# Patient Record
Sex: Female | Born: 1950 | Race: White | Hispanic: No | Marital: Married | Smoking: Former smoker
Health system: Southern US, Community
[De-identification: ages and names within clinical notes are randomized; demographics above are authoritative.]

## PROBLEM LIST (undated history)

## (undated) DIAGNOSIS — N390 Urinary tract infection, site not specified: Secondary | ICD-10-CM

## (undated) DIAGNOSIS — E119 Type 2 diabetes mellitus without complications: Secondary | ICD-10-CM

## (undated) DIAGNOSIS — Z860100 Personal history of colon polyps, unspecified: Secondary | ICD-10-CM

## (undated) DIAGNOSIS — R600 Localized edema: Secondary | ICD-10-CM

## (undated) DIAGNOSIS — G8929 Other chronic pain: Secondary | ICD-10-CM

## (undated) DIAGNOSIS — F32A Depression, unspecified: Secondary | ICD-10-CM

## (undated) DIAGNOSIS — E785 Hyperlipidemia, unspecified: Secondary | ICD-10-CM

## (undated) DIAGNOSIS — M549 Dorsalgia, unspecified: Secondary | ICD-10-CM

## (undated) DIAGNOSIS — M2550 Pain in unspecified joint: Secondary | ICD-10-CM

## (undated) DIAGNOSIS — M069 Rheumatoid arthritis, unspecified: Secondary | ICD-10-CM

## (undated) DIAGNOSIS — G4700 Insomnia, unspecified: Secondary | ICD-10-CM

## (undated) DIAGNOSIS — M1990 Unspecified osteoarthritis, unspecified site: Secondary | ICD-10-CM

## (undated) DIAGNOSIS — C55 Malignant neoplasm of uterus, part unspecified: Secondary | ICD-10-CM

## (undated) DIAGNOSIS — F329 Major depressive disorder, single episode, unspecified: Secondary | ICD-10-CM

## (undated) DIAGNOSIS — M2540 Effusion, unspecified joint: Secondary | ICD-10-CM

## (undated) DIAGNOSIS — K219 Gastro-esophageal reflux disease without esophagitis: Secondary | ICD-10-CM

## (undated) DIAGNOSIS — N393 Stress incontinence (female) (male): Secondary | ICD-10-CM

## (undated) DIAGNOSIS — G43909 Migraine, unspecified, not intractable, without status migrainosus: Secondary | ICD-10-CM

## (undated) DIAGNOSIS — I10 Essential (primary) hypertension: Secondary | ICD-10-CM

## (undated) DIAGNOSIS — R609 Edema, unspecified: Secondary | ICD-10-CM

## (undated) DIAGNOSIS — I82409 Acute embolism and thrombosis of unspecified deep veins of unspecified lower extremity: Secondary | ICD-10-CM

## (undated) HISTORY — PX: COLONOSCOPY: SHX174

## (undated) HISTORY — PX: EYE SURGERY: SHX253

## (undated) HISTORY — PX: JOINT REPLACEMENT: SHX530

---

## 1976-11-01 HISTORY — PX: TUBAL LIGATION: SHX77

## 1979-07-03 HISTORY — PX: BREAST BIOPSY: SHX20

## 1989-07-02 HISTORY — PX: CHOLECYSTECTOMY: SHX55

## 2012-01-31 HISTORY — PX: ABDOMINAL HYSTERECTOMY: SHX81

## 2013-04-02 ENCOUNTER — Other Ambulatory Visit (HOSPITAL_BASED_OUTPATIENT_CLINIC_OR_DEPARTMENT_OTHER): Payer: Self-pay | Admitting: Neurology

## 2013-04-02 DIAGNOSIS — M5416 Radiculopathy, lumbar region: Secondary | ICD-10-CM

## 2013-04-07 ENCOUNTER — Ambulatory Visit (HOSPITAL_BASED_OUTPATIENT_CLINIC_OR_DEPARTMENT_OTHER)
Admission: RE | Admit: 2013-04-07 | Discharge: 2013-04-07 | Disposition: A | Discharge status: Home / Self Care | Payer: BC Managed Care – PPO | Source: Ambulatory Visit | Attending: Neurology | Admitting: Neurology

## 2013-04-07 DIAGNOSIS — M5416 Radiculopathy, lumbar region: Secondary | ICD-10-CM

## 2013-04-07 DIAGNOSIS — R29890 Loss of height: Secondary | ICD-10-CM | POA: Insufficient documentation

## 2013-04-07 DIAGNOSIS — Z8542 Personal history of malignant neoplasm of other parts of uterus: Secondary | ICD-10-CM | POA: Insufficient documentation

## 2013-04-07 DIAGNOSIS — M79609 Pain in unspecified limb: Secondary | ICD-10-CM | POA: Insufficient documentation

## 2013-04-07 DIAGNOSIS — M51379 Other intervertebral disc degeneration, lumbosacral region without mention of lumbar back pain or lower extremity pain: Secondary | ICD-10-CM | POA: Insufficient documentation

## 2013-04-07 DIAGNOSIS — M5137 Other intervertebral disc degeneration, lumbosacral region: Secondary | ICD-10-CM | POA: Insufficient documentation

## 2013-04-07 DIAGNOSIS — M47817 Spondylosis without myelopathy or radiculopathy, lumbosacral region: Secondary | ICD-10-CM | POA: Insufficient documentation

## 2013-10-17 ENCOUNTER — Other Ambulatory Visit: Payer: Self-pay | Admitting: Rheumatology

## 2013-10-17 ENCOUNTER — Ambulatory Visit
Admission: RE | Admit: 2013-10-17 | Discharge: 2013-10-17 | Disposition: A | Discharge status: Home / Self Care | Payer: BC Managed Care – PPO | Source: Ambulatory Visit | Attending: Rheumatology | Admitting: Rheumatology

## 2013-10-17 DIAGNOSIS — M25552 Pain in left hip: Secondary | ICD-10-CM

## 2013-10-17 DIAGNOSIS — M25551 Pain in right hip: Secondary | ICD-10-CM

## 2013-10-19 ENCOUNTER — Other Ambulatory Visit: Payer: Self-pay | Admitting: Rheumatology

## 2013-10-19 DIAGNOSIS — M25552 Pain in left hip: Secondary | ICD-10-CM

## 2013-10-23 ENCOUNTER — Ambulatory Visit
Admission: RE | Admit: 2013-10-23 | Discharge: 2013-10-23 | Disposition: A | Discharge status: Home / Self Care | Payer: BC Managed Care – PPO | Source: Ambulatory Visit | Attending: Rheumatology | Admitting: Rheumatology

## 2013-10-23 DIAGNOSIS — M25552 Pain in left hip: Secondary | ICD-10-CM

## 2013-10-23 MED ORDER — IOHEXOL 180 MG/ML  SOLN
1.0000 mL | Freq: Once | INTRAMUSCULAR | Status: AC | PRN
  Administered 2013-10-23: 1 mL via INTRA_ARTICULAR

## 2013-10-23 MED ORDER — METHYLPREDNISOLONE ACETATE 40 MG/ML INJ SUSP (RADIOLOG
120.0000 mg | Freq: Once | INTRAMUSCULAR | Status: AC
  Administered 2013-10-23: 120 mg via INTRA_ARTICULAR

## 2013-12-11 ENCOUNTER — Other Ambulatory Visit: Payer: Self-pay | Admitting: Rheumatology

## 2013-12-11 DIAGNOSIS — M25551 Pain in right hip: Secondary | ICD-10-CM

## 2013-12-11 DIAGNOSIS — R1030 Lower abdominal pain, unspecified: Secondary | ICD-10-CM

## 2013-12-12 ENCOUNTER — Ambulatory Visit
Admission: RE | Admit: 2013-12-12 | Discharge: 2013-12-12 | Disposition: A | Discharge status: Home / Self Care | Payer: BC Managed Care – PPO | Source: Ambulatory Visit | Attending: Rheumatology | Admitting: Rheumatology

## 2013-12-12 DIAGNOSIS — R1030 Lower abdominal pain, unspecified: Secondary | ICD-10-CM

## 2013-12-12 DIAGNOSIS — M25551 Pain in right hip: Secondary | ICD-10-CM

## 2013-12-12 MED ORDER — IOHEXOL 180 MG/ML  SOLN
1.0000 mL | Freq: Once | INTRAMUSCULAR | Status: AC | PRN
  Administered 2013-12-12: 1 mL via INTRAVENOUS

## 2013-12-12 MED ORDER — METHYLPREDNISOLONE ACETATE 40 MG/ML INJ SUSP (RADIOLOG
120.0000 mg | Freq: Once | INTRAMUSCULAR | Status: AC
  Administered 2013-12-12: 120 mg via INTRA_ARTICULAR

## 2014-06-11 ENCOUNTER — Other Ambulatory Visit: Payer: Self-pay | Admitting: Orthopaedic Surgery

## 2014-07-03 ENCOUNTER — Encounter (HOSPITAL_COMMUNITY): Payer: Self-pay | Admitting: Pharmacy Technician

## 2014-07-04 NOTE — Pre-Procedure Instructions (Signed)
Ashley Sims  07/04/2014   Your procedure is scheduled on:  Tues, Sept 15 @ 7:30 AM  Report to [facility]  at 5:30 AM.  Call this number if you have problems the morning of surgery: [phone]   Remember:   Do not eat food or drink liquids after midnight.   Take these medicines the morning of surgery with A SIP OF WATER: Pain Pill(if needed) and Zoloft(Sertraline)              Stop taking your Methotrexate and Meloxicam. No Goody's,BC's,Aleve,Aspirin,Ibuprofen,Fish Oil,or any Herbal Medicaitons                  Do not wear jewelry, make-up or nail polish.  Do not wear lotions, powders, or perfumes. You may wear deodorant.  Do not shave 48 hours prior to surgery.   Do not bring valuables to the hospital.  [facility] is not responsible                  for any belongings or valuables.               Contacts, dentures or bridgework may not be worn into surgery.  Leave suitcase in the car. After surgery it may be brought to your room.  For patients admitted to the hospital, discharge time is determined by your                treatment team.              Special Instructions:  [place] - Preparing for Surgery  Before surgery, you can play an important role.  Because skin is not sterile, your skin needs to be as free of germs as possible.  You can reduce the number of germs on you skin by washing with CHG (chlorahexidine gluconate) soap before surgery.  CHG is an antiseptic cleaner which kills germs and bonds with the skin to continue killing germs even after washing.  Please DO NOT use if you have an allergy to CHG or antibacterial soaps.  If your skin becomes reddened/irritated stop using the CHG and inform your nurse when you arrive at Short Stay.  Do not shave (including legs and underarms) for at least 48 hours prior to the first CHG shower.  You may shave your face.  Please follow these instructions carefully:   1.  Shower with CHG Soap the night before surgery and  the                                morning of Surgery.  2.  If you choose to wash your hair, wash your hair first as usual with your       normal shampoo.  3.  After you shampoo, rinse your hair and body thoroughly to remove the                      Shampoo.  4.  Use CHG as you would any other liquid soap.  You can apply chg directly       to the skin and wash gently with scrungie or a clean washcloth.  5.  Apply the CHG Soap to your body ONLY FROM THE NECK DOWN.        Do not use on open wounds or open sores.  Avoid contact with your eyes,       ears, mouth  and genitals (private parts).  Wash genitals (private parts)       with your normal soap.  6.  Wash thoroughly, paying special attention to the area where your surgery        will be performed.  7.  Thoroughly rinse your body with warm water from the neck down.  8.  DO NOT shower/wash with your normal soap after using and rinsing off       the CHG Soap.  9.  Pat yourself dry with a clean towel.            10.  Wear clean pajamas.            11.  Place clean sheets on your bed the night of your first shower and do not        sleep with pets.  Day of Surgery  Do not apply any lotions/deoderants the morning of surgery.  Please wear clean clothes to the hospital/surgery center.     Please read over the following fact sheets that you were given: Pain Booklet, Coughing and Deep Breathing, Blood Transfusion Information, MRSA Information and Surgical Site Infection Prevention

## 2014-07-05 ENCOUNTER — Encounter (HOSPITAL_COMMUNITY)
Admission: RE | Admit: 2014-07-05 | Discharge: 2014-07-05 | Disposition: A | Discharge status: Home / Self Care | Payer: BC Managed Care – PPO | Source: Ambulatory Visit | Attending: Orthopaedic Surgery | Admitting: Orthopaedic Surgery

## 2014-07-05 ENCOUNTER — Ambulatory Visit (HOSPITAL_COMMUNITY)
Admission: RE | Admit: 2014-07-05 | Discharge: 2014-07-05 | Disposition: A | Discharge status: Home / Self Care | Payer: BC Managed Care – PPO | Source: Ambulatory Visit | Attending: Orthopaedic Surgery | Admitting: Orthopaedic Surgery

## 2014-07-05 ENCOUNTER — Encounter (HOSPITAL_COMMUNITY): Payer: Self-pay

## 2014-07-05 DIAGNOSIS — Z87891 Personal history of nicotine dependence: Secondary | ICD-10-CM | POA: Insufficient documentation

## 2014-07-05 DIAGNOSIS — Z9889 Other specified postprocedural states: Secondary | ICD-10-CM | POA: Insufficient documentation

## 2014-07-05 DIAGNOSIS — Z01818 Encounter for other preprocedural examination: Secondary | ICD-10-CM | POA: Insufficient documentation

## 2014-07-05 DIAGNOSIS — E119 Type 2 diabetes mellitus without complications: Secondary | ICD-10-CM | POA: Insufficient documentation

## 2014-07-05 HISTORY — DX: Urinary tract infection, site not specified: N39.0

## 2014-07-05 HISTORY — DX: Essential (primary) hypertension: I10

## 2014-07-05 HISTORY — DX: Insomnia, unspecified: G47.00

## 2014-07-05 HISTORY — DX: Hyperlipidemia, unspecified: E78.5

## 2014-07-05 HISTORY — DX: Personal history of colonic polyps: Z86.010

## 2014-07-05 HISTORY — DX: Depression, unspecified: F32.A

## 2014-07-05 HISTORY — DX: Dorsalgia, unspecified: M54.9

## 2014-07-05 HISTORY — DX: Pain in unspecified joint: M25.50

## 2014-07-05 HISTORY — DX: Localized edema: R60.0

## 2014-07-05 HISTORY — DX: Stress incontinence (female) (male): N39.3

## 2014-07-05 HISTORY — DX: Other chronic pain: G89.29

## 2014-07-05 HISTORY — DX: Gastro-esophageal reflux disease without esophagitis: K21.9

## 2014-07-05 HISTORY — DX: Edema, unspecified: R60.9

## 2014-07-05 HISTORY — DX: Personal history of colon polyps, unspecified: Z86.0100

## 2014-07-05 HISTORY — DX: Effusion, unspecified joint: M25.40

## 2014-07-05 HISTORY — DX: Major depressive disorder, single episode, unspecified: F32.9

## 2014-07-05 LAB — CBC WITH DIFFERENTIAL/PLATELET
Basophils Absolute: 0 10*3/uL (ref 0.0–0.1)
Basophils Relative: 1 % (ref 0–1)
EOS ABS: 0.1 10*3/uL (ref 0.0–0.7)
Eosinophils Relative: 2 % (ref 0–5)
HEMATOCRIT: 40.7 % (ref 36.0–46.0)
Hemoglobin: 13.3 g/dL (ref 12.0–15.0)
LYMPHS ABS: 0.5 10*3/uL — AB (ref 0.7–4.0)
Lymphocytes Relative: 8 % — ABNORMAL LOW (ref 12–46)
MCH: 30.8 pg (ref 26.0–34.0)
MCHC: 32.7 g/dL (ref 30.0–36.0)
MCV: 94.2 fL (ref 78.0–100.0)
MONO ABS: 0.4 10*3/uL (ref 0.1–1.0)
Monocytes Relative: 7 % (ref 3–12)
Neutro Abs: 4.9 10*3/uL (ref 1.7–7.7)
Neutrophils Relative %: 82 % — ABNORMAL HIGH (ref 43–77)
PLATELETS: 301 10*3/uL (ref 150–400)
RBC: 4.32 MIL/uL (ref 3.87–5.11)
RDW: 14.8 % (ref 11.5–15.5)
WBC: 6 10*3/uL (ref 4.0–10.5)

## 2014-07-05 LAB — TYPE AND SCREEN
ABO/RH(D): A POS
Antibody Screen: NEGATIVE

## 2014-07-05 LAB — PROTIME-INR
INR: 0.94 (ref 0.00–1.49)
PROTHROMBIN TIME: 12.6 s (ref 11.6–15.2)

## 2014-07-05 LAB — BASIC METABOLIC PANEL
Anion gap: 13 (ref 5–15)
BUN: 15 mg/dL (ref 6–23)
CALCIUM: 9.8 mg/dL (ref 8.4–10.5)
CO2: 25 meq/L (ref 19–32)
Chloride: 103 mEq/L (ref 96–112)
Creatinine, Ser: 0.75 mg/dL (ref 0.50–1.10)
GFR calc Af Amer: >90 mL/min (ref >90)
GFR calc non Af Amer: 88 mL/min — ABNORMAL LOW (ref >90)
GLUCOSE: 146 mg/dL — AB (ref 70–99)
Potassium: 4.2 mEq/L (ref 3.7–5.3)
Sodium: 141 mEq/L (ref 137–147)

## 2014-07-05 LAB — URINALYSIS, ROUTINE W REFLEX MICROSCOPIC
BILIRUBIN URINE: NEGATIVE
Glucose, UA: NEGATIVE mg/dL
Hgb urine dipstick: NEGATIVE
Ketones, ur: NEGATIVE mg/dL
LEUKOCYTES UA: NEGATIVE
Nitrite: NEGATIVE
PH: 5.5 (ref 5.0–8.0)
Protein, ur: NEGATIVE mg/dL
SPECIFIC GRAVITY, URINE: 1.008 (ref 1.005–1.030)
UROBILINOGEN UA: 0.2 mg/dL (ref 0.0–1.0)

## 2014-07-05 LAB — SURGICAL PCR SCREEN
MRSA, PCR: NEGATIVE
Staphylococcus aureus: NEGATIVE

## 2014-07-05 LAB — ABO/RH: ABO/RH(D): A POS

## 2014-07-05 LAB — APTT: aPTT: 26 seconds (ref 24–37)

## 2014-07-05 MED ORDER — CHLORHEXIDINE GLUCONATE 4 % EX LIQD: 60.0000 mL | Freq: Once | CUTANEOUS | Status: DC

## 2014-07-05 NOTE — Progress Notes (Signed)
07/05/14 1117  OBSTRUCTIVE SLEEP APNEA  Have you ever been diagnosed with sleep apnea through a sleep study? No  Do you snore loudly (loud enough to be heard through closed doors)?  1  Do you often feel tired, fatigued, or sleepy during the daytime? 0  Has anyone observed you stop breathing during your sleep? 0  Do you have, or are you being treated for high blood pressure? 1  BMI more than 35 kg/m2? 1  Age over 65 years old? 1  Neck circumference greater than 40 cm/16 inches? 1  Gender: 0  Obstructive Sleep Apnea Score 5  Score 4 or greater  Results sent to PCP

## 2014-07-05 NOTE — Progress Notes (Signed)
Pt doesn't have a cardiologist  Stress test done 14 yrs ago-done with routine physical   Denies ever having an echo or heart cath  [facility] in [place] sees PA Iva Lento  Denies EKG or CXR in past yr

## 2014-07-12 NOTE — H&P (Signed)
TOTAL HIP ADMISSION H&P  Patient is admitted for right total hip arthroplasty.  Subjective:  Chief Complaint: right hip pain  HPI: Ashley Sims, 63 y.o. female, has a history of pain and functional disability in the right hip(s) due to arthritis and patient has failed non-surgical conservative treatments for greater than 12 weeks to include NSAID's and/or analgesics, corticosteriod injections, flexibility and strengthening excercises, supervised PT with diminished ADL's post treatment, use of assistive devices, weight reduction as appropriate and activity modification.  Onset of symptoms was gradual starting 5 years ago with gradually worsening course since that time.The patient noted no past surgery on the right hip(s).  Patient currently rates pain in the bilaterally hip at 10 out of 10 with activity. Patient has night pain, worsening of pain with activity and weight bearing, pain that interfers with activities of daily living and crepitus. Patient has evidence of subchondral cysts, subchondral sclerosis, periarticular osteophytes and joint space narrowing by imaging studies. This condition presents safety issues increasing the risk of falls. There is no current active infection.  There are no active problems to display for this patient.  Past Medical History  Diagnosis Date  . Insomnia     takes Elavil nightly  . Hypertension     takes Lisinopril daily  . Diabetes mellitus without complication     takes Metformin daily  . Depression     takes Zoloft daily  . Hyperlipidemia     takes Simvastatin daily  . History of migraine     hasn't had one in yrs  . Cancer     uterine  . Arthritis     RA and osteo;takes Plaquenil daily and takes Methotrexate daily  . Chronic back pain     [facility];takes Oxycontin and PErcocet  . Joint pain   . Joint swelling   . GERD (gastroesophageal reflux disease)     takes Protonix daily  . History of colon polyps   . Stress  incontinence   . Peripheral edema     left foot  . UTI (lower urinary tract infection)     was treated with Cipro 3wks ago    Past Surgical History  Procedure Laterality Date  . Abdominal hysterectomy    . Cesarean section    . Breast biopsy    . Colonoscopy      No prescriptions prior to admission   Allergies  Allergen Reactions  . Bee Venom   . Tramadol     History  Substance Use Topics  . Smoking status: Former Research scientist (life sciences)  . Smokeless tobacco: Not on file     Comment: quit smoking 39yrs ago  . Alcohol Use: No    No family history on file.   Review of Systems  Musculoskeletal: Positive for joint pain.       Right hip  All other systems reviewed and are negative.   Objective:  Physical Exam  Constitutional: She is oriented to person, place, and time. She appears well-developed and well-nourished.  HENT:  Head: Normocephalic and atraumatic.  Eyes: Conjunctivae are normal. Pupils are equal, round, and reactive to light.  Neck: Normal range of motion.  Cardiovascular: Normal rate and regular rhythm.   Respiratory: Effort normal.  GI: Soft.  Musculoskeletal:  She has virtually no motion of her hips in terms of rotation.  She has about 90 of flexion on both sides with about 10 hip flexion contractures.  Leg lengths are roughly equal.  There is some  pain about her low back but no scars.  Abdominal exam is benign except for obesity.  She has no palpable lymphadenopathy at the groin and either side.  Sensation and motor function are intact in her feet with palpable pulses at both ankles.    Neurological: She is alert and oriented to person, place, and time.  Skin: Skin is warm and dry.  Psychiatric: She has a normal mood and affect. Her behavior is normal. Judgment and thought content normal.    Vital signs in last 24 hours:    Labs:   There is no height or weight on file to calculate BMI.   Imaging Review Plain radiographs demonstrate severe degenerative joint  disease of the right hip(s). The bone quality appears to be good for age and reported activity level.  Assessment/Plan:  End stage arthritis, right hip(s)  The patient history, physical examination, clinical judgement of the provider and imaging studies are consistent with end stage degenerative joint disease of the right hip(s) and total hip arthroplasty is deemed medically necessary. The treatment options including medical management, injection therapy, arthroscopy and arthroplasty were discussed at length. The risks and benefits of total hip arthroplasty were presented and reviewed. The risks due to aseptic loosening, infection, stiffness, dislocation/subluxation,  thromboembolic complications and other imponderables were discussed.  The patient acknowledged the explanation, agreed to proceed with the plan and consent was signed. Patient is being admitted for inpatient treatment for surgery, pain control, PT, OT, prophylactic antibiotics, VTE prophylaxis, progressive ambulation and ADL's and discharge planning.The patient is planning to be discharged home with home health services

## 2014-07-15 MED ORDER — CEFAZOLIN SODIUM-DEXTROSE 2-3 GM-% IV SOLR
2.0000 g | INTRAVENOUS | Status: AC
  Administered 2014-07-16: 2 g via INTRAVENOUS
  Filled 2014-07-15: qty 50

## 2014-07-16 ENCOUNTER — Encounter (HOSPITAL_COMMUNITY)
Admission: RE | Disposition: A | Discharge status: Skilled Nursing Facility | Payer: Self-pay | Source: Ambulatory Visit | Attending: Orthopaedic Surgery

## 2014-07-16 ENCOUNTER — Inpatient Hospital Stay (HOSPITAL_COMMUNITY): Payer: BC Managed Care – PPO | Admitting: Anesthesiology

## 2014-07-16 ENCOUNTER — Encounter (HOSPITAL_COMMUNITY): Payer: Self-pay | Admitting: *Deleted

## 2014-07-16 ENCOUNTER — Inpatient Hospital Stay (HOSPITAL_COMMUNITY)
Admission: RE | Admit: 2014-07-16 | Discharge: 2014-07-19 | DRG: 470 | Disposition: A | Discharge status: Skilled Nursing Facility | Payer: BC Managed Care – PPO | Source: Ambulatory Visit | Attending: Orthopaedic Surgery | Admitting: Orthopaedic Surgery

## 2014-07-16 ENCOUNTER — Encounter (HOSPITAL_COMMUNITY): Payer: BC Managed Care – PPO | Admitting: Anesthesiology

## 2014-07-16 ENCOUNTER — Inpatient Hospital Stay (HOSPITAL_COMMUNITY): Payer: BC Managed Care – PPO

## 2014-07-16 DIAGNOSIS — Z860100 Personal history of colon polyps, unspecified: Secondary | ICD-10-CM

## 2014-07-16 DIAGNOSIS — Z91038 Other insect allergy status: Secondary | ICD-10-CM | POA: Diagnosis not present

## 2014-07-16 DIAGNOSIS — F3289 Other specified depressive episodes: Secondary | ICD-10-CM | POA: Diagnosis present

## 2014-07-16 DIAGNOSIS — Z7982 Long term (current) use of aspirin: Secondary | ICD-10-CM | POA: Diagnosis not present

## 2014-07-16 DIAGNOSIS — Z888 Allergy status to other drugs, medicaments and biological substances status: Secondary | ICD-10-CM | POA: Diagnosis not present

## 2014-07-16 DIAGNOSIS — K219 Gastro-esophageal reflux disease without esophagitis: Secondary | ICD-10-CM | POA: Diagnosis present

## 2014-07-16 DIAGNOSIS — N393 Stress incontinence (female) (male): Secondary | ICD-10-CM | POA: Diagnosis present

## 2014-07-16 DIAGNOSIS — E119 Type 2 diabetes mellitus without complications: Secondary | ICD-10-CM | POA: Diagnosis present

## 2014-07-16 DIAGNOSIS — I10 Essential (primary) hypertension: Secondary | ICD-10-CM | POA: Diagnosis present

## 2014-07-16 DIAGNOSIS — M549 Dorsalgia, unspecified: Secondary | ICD-10-CM | POA: Diagnosis present

## 2014-07-16 DIAGNOSIS — F329 Major depressive disorder, single episode, unspecified: Secondary | ICD-10-CM | POA: Diagnosis present

## 2014-07-16 DIAGNOSIS — G8929 Other chronic pain: Secondary | ICD-10-CM | POA: Diagnosis present

## 2014-07-16 DIAGNOSIS — E785 Hyperlipidemia, unspecified: Secondary | ICD-10-CM | POA: Diagnosis present

## 2014-07-16 DIAGNOSIS — M1610 Unilateral primary osteoarthritis, unspecified hip: Principal | ICD-10-CM | POA: Diagnosis present

## 2014-07-16 DIAGNOSIS — Z87891 Personal history of nicotine dependence: Secondary | ICD-10-CM | POA: Diagnosis not present

## 2014-07-16 DIAGNOSIS — Z79899 Other long term (current) drug therapy: Secondary | ICD-10-CM | POA: Diagnosis not present

## 2014-07-16 DIAGNOSIS — M1611 Unilateral primary osteoarthritis, right hip: Secondary | ICD-10-CM

## 2014-07-16 DIAGNOSIS — M169 Osteoarthritis of hip, unspecified: Secondary | ICD-10-CM | POA: Diagnosis present

## 2014-07-16 DIAGNOSIS — M25559 Pain in unspecified hip: Secondary | ICD-10-CM | POA: Diagnosis present

## 2014-07-16 DIAGNOSIS — G4700 Insomnia, unspecified: Secondary | ICD-10-CM | POA: Diagnosis present

## 2014-07-16 HISTORY — PX: TOTAL HIP ARTHROPLASTY: SHX124

## 2014-07-16 LAB — GLUCOSE, CAPILLARY
GLUCOSE-CAPILLARY: 143 mg/dL — AB (ref 70–99)
Glucose-Capillary: 231 mg/dL — ABNORMAL HIGH (ref 70–99)
Glucose-Capillary: 179 mg/dL — ABNORMAL HIGH (ref 70–99)
Glucose-Capillary: 188 mg/dL — ABNORMAL HIGH (ref 70–99)

## 2014-07-16 SURGERY — ARTHROPLASTY, HIP, TOTAL, ANTERIOR APPROACH
Anesthesia: General | Site: Hip | Laterality: Right

## 2014-07-16 MED ORDER — HYDROXYCHLOROQUINE SULFATE 200 MG PO TABS
200.0000 mg | ORAL_TABLET | Freq: Every day | ORAL | Status: DC
  Administered 2014-07-16 – 2014-07-19 (×4): 200 mg via ORAL
  Filled 2014-07-16 (×4): qty 1

## 2014-07-16 MED ORDER — OXYCODONE HCL ER 10 MG PO T12A
10.0000 mg | EXTENDED_RELEASE_TABLET | Freq: Two times a day (BID) | ORAL | Status: DC
  Administered 2014-07-16 – 2014-07-19 (×7): 10 mg via ORAL
  Filled 2014-07-16 (×7): qty 1

## 2014-07-16 MED ORDER — STERILE WATER FOR INJECTION IJ SOLN
INTRAMUSCULAR | Status: AC
  Filled 2014-07-16: qty 10

## 2014-07-16 MED ORDER — LIDOCAINE HCL (CARDIAC) 20 MG/ML IV SOLN
INTRAVENOUS | Status: AC
  Filled 2014-07-16: qty 5

## 2014-07-16 MED ORDER — AMITRIPTYLINE HCL 100 MG PO TABS
100.0000 mg | ORAL_TABLET | Freq: Every day | ORAL | Status: DC
  Administered 2014-07-16 – 2014-07-18 (×3): 100 mg via ORAL
  Filled 2014-07-16 (×4): qty 1

## 2014-07-16 MED ORDER — FENTANYL CITRATE 0.05 MG/ML IJ SOLN
INTRAMUSCULAR | Status: DC | PRN
Start: 2014-07-16 — End: 2014-07-16
  Administered 2014-07-16 (×2): 50 ug via INTRAVENOUS
  Administered 2014-07-16: 100 ug via INTRAVENOUS
  Administered 2014-07-16 (×6): 50 ug via INTRAVENOUS

## 2014-07-16 MED ORDER — FOLIC ACID 1 MG PO TABS
1.0000 mg | ORAL_TABLET | Freq: Every day | ORAL | Status: DC
  Administered 2014-07-17 – 2014-07-19 (×3): 1 mg via ORAL
  Filled 2014-07-16 (×4): qty 1

## 2014-07-16 MED ORDER — ONDANSETRON HCL 4 MG/2ML IJ SOLN
INTRAMUSCULAR | Status: AC
  Filled 2014-07-16: qty 2

## 2014-07-16 MED ORDER — 0.9 % SODIUM CHLORIDE (POUR BTL) OPTIME
TOPICAL | Status: DC | PRN
  Administered 2014-07-16: 1000 mL

## 2014-07-16 MED ORDER — CALCIUM CARBONATE 600 MG PO TABS: 600.0000 mg | ORAL_TABLET | Freq: Two times a day (BID) | ORAL | Status: DC

## 2014-07-16 MED ORDER — PROPOFOL 10 MG/ML IV BOLUS
INTRAVENOUS | Status: DC | PRN
  Administered 2014-07-16: 50 mg via INTRAVENOUS
  Administered 2014-07-16: 150 mg via INTRAVENOUS

## 2014-07-16 MED ORDER — VITAMIN D3 25 MCG (1000 UNIT) PO TABS
1000.0000 [IU] | ORAL_TABLET | Freq: Every day | ORAL | Status: DC
  Administered 2014-07-17 – 2014-07-19 (×3): 1000 [IU] via ORAL
  Filled 2014-07-16 (×4): qty 1

## 2014-07-16 MED ORDER — ONDANSETRON HCL 4 MG PO TABS: 4.0000 mg | ORAL_TABLET | Freq: Four times a day (QID) | ORAL | Status: DC | PRN

## 2014-07-16 MED ORDER — METHOCARBAMOL 500 MG PO TABS
ORAL_TABLET | ORAL | Status: AC
  Filled 2014-07-16: qty 1

## 2014-07-16 MED ORDER — FENTANYL CITRATE 0.05 MG/ML IJ SOLN
INTRAMUSCULAR | Status: AC
  Filled 2014-07-16: qty 2

## 2014-07-16 MED ORDER — DIPHENHYDRAMINE HCL 12.5 MG/5ML PO ELIX: 12.5000 mg | ORAL_SOLUTION | ORAL | Status: DC | PRN

## 2014-07-16 MED ORDER — ROCURONIUM BROMIDE 100 MG/10ML IV SOLN
INTRAVENOUS | Status: DC | PRN
  Administered 2014-07-16: 10 mg via INTRAVENOUS
  Administered 2014-07-16: 50 mg via INTRAVENOUS

## 2014-07-16 MED ORDER — GLYCOPYRROLATE 0.2 MG/ML IJ SOLN
INTRAMUSCULAR | Status: DC | PRN
  Administered 2014-07-16: 0.4 mg via INTRAVENOUS

## 2014-07-16 MED ORDER — ROCURONIUM BROMIDE 50 MG/5ML IV SOLN
INTRAVENOUS | Status: AC
  Filled 2014-07-16: qty 1

## 2014-07-16 MED ORDER — EPHEDRINE SULFATE 50 MG/ML IJ SOLN
INTRAMUSCULAR | Status: AC
  Filled 2014-07-16: qty 1

## 2014-07-16 MED ORDER — LISINOPRIL 10 MG PO TABS
10.0000 mg | ORAL_TABLET | Freq: Every day | ORAL | Status: DC
  Administered 2014-07-16 – 2014-07-19 (×4): 10 mg via ORAL
  Filled 2014-07-16 (×4): qty 1

## 2014-07-16 MED ORDER — ONDANSETRON HCL 4 MG/2ML IJ SOLN: 4.0000 mg | Freq: Four times a day (QID) | INTRAMUSCULAR | Status: DC | PRN

## 2014-07-16 MED ORDER — ARTIFICIAL TEARS OP OINT
TOPICAL_OINTMENT | OPHTHALMIC | Status: AC
  Filled 2014-07-16: qty 3.5

## 2014-07-16 MED ORDER — CEFAZOLIN SODIUM-DEXTROSE 2-3 GM-% IV SOLR
2.0000 g | Freq: Four times a day (QID) | INTRAVENOUS | Status: AC
  Administered 2014-07-16 (×2): 2 g via INTRAVENOUS
  Filled 2014-07-16 (×2): qty 50

## 2014-07-16 MED ORDER — METOCLOPRAMIDE HCL 10 MG PO TABS: 5.0000 mg | ORAL_TABLET | Freq: Three times a day (TID) | ORAL | Status: DC | PRN

## 2014-07-16 MED ORDER — LACTATED RINGERS IV SOLN: INTRAVENOUS | Status: DC

## 2014-07-16 MED ORDER — MENTHOL 3 MG MT LOZG: 1.0000 | LOZENGE | OROMUCOSAL | Status: DC | PRN

## 2014-07-16 MED ORDER — HYDROMORPHONE HCL PF 1 MG/ML IJ SOLN
INTRAMUSCULAR | Status: AC
  Administered 2014-07-16: 1 mg via INTRAVENOUS
  Filled 2014-07-16: qty 1

## 2014-07-16 MED ORDER — ALUM & MAG HYDROXIDE-SIMETH 200-200-20 MG/5ML PO SUSP: 30.0000 mL | ORAL | Status: DC | PRN

## 2014-07-16 MED ORDER — BISACODYL 5 MG PO TBEC: 5.0000 mg | DELAYED_RELEASE_TABLET | Freq: Every day | ORAL | Status: DC | PRN

## 2014-07-16 MED ORDER — METHOCARBAMOL 500 MG PO TABS
500.0000 mg | ORAL_TABLET | Freq: Four times a day (QID) | ORAL | Status: DC | PRN
  Administered 2014-07-16 – 2014-07-19 (×7): 500 mg via ORAL
  Filled 2014-07-16 (×6): qty 1

## 2014-07-16 MED ORDER — NEOSTIGMINE METHYLSULFATE 10 MG/10ML IV SOLN
INTRAVENOUS | Status: DC | PRN
  Administered 2014-07-16: 3 mg via INTRAVENOUS

## 2014-07-16 MED ORDER — NEOSTIGMINE METHYLSULFATE 10 MG/10ML IV SOLN
INTRAVENOUS | Status: AC
  Filled 2014-07-16: qty 1

## 2014-07-16 MED ORDER — EPHEDRINE SULFATE 50 MG/ML IJ SOLN
INTRAMUSCULAR | Status: DC | PRN
  Administered 2014-07-16: 5 mg via INTRAVENOUS
  Administered 2014-07-16: 7.5 mg via INTRAVENOUS
  Administered 2014-07-16: 5 mg via INTRAVENOUS
  Administered 2014-07-16: 10 mg via INTRAVENOUS

## 2014-07-16 MED ORDER — INFLUENZA VAC SPLIT QUAD 0.5 ML IM SUSY
0.5000 mL | PREFILLED_SYRINGE | INTRAMUSCULAR | Status: AC
  Administered 2014-07-17: 0.5 mL via INTRAMUSCULAR
  Filled 2014-07-16: qty 0.5

## 2014-07-16 MED ORDER — OXYCODONE HCL 5 MG PO TABS
ORAL_TABLET | ORAL | Status: AC
  Filled 2014-07-16: qty 1

## 2014-07-16 MED ORDER — CALCIUM CARBONATE 1250 (500 CA) MG PO TABS
1.0000 | ORAL_TABLET | Freq: Two times a day (BID) | ORAL | Status: DC
  Administered 2014-07-16 – 2014-07-19 (×6): 500 mg via ORAL
  Filled 2014-07-16 (×8): qty 1

## 2014-07-16 MED ORDER — INSULIN ASPART 100 UNIT/ML ~~LOC~~ SOLN
0.0000 [IU] | Freq: Three times a day (TID) | SUBCUTANEOUS | Status: DC
  Administered 2014-07-16: 4 [IU] via SUBCUTANEOUS
  Administered 2014-07-16: 7 [IU] via SUBCUTANEOUS
  Administered 2014-07-18: 4 [IU] via SUBCUTANEOUS
  Administered 2014-07-18: 7 [IU] via SUBCUTANEOUS
  Administered 2014-07-18 – 2014-07-19 (×3): 4 [IU] via SUBCUTANEOUS

## 2014-07-16 MED ORDER — LACTATED RINGERS IV SOLN
INTRAVENOUS | Status: DC
  Administered 2014-07-16 – 2014-07-17 (×2): via INTRAVENOUS

## 2014-07-16 MED ORDER — METHOCARBAMOL 1000 MG/10ML IJ SOLN
500.0000 mg | Freq: Four times a day (QID) | INTRAVENOUS | Status: DC | PRN
  Filled 2014-07-16: qty 5

## 2014-07-16 MED ORDER — METHOTREXATE 2.5 MG PO TABS: 12.5000 mg | ORAL_TABLET | ORAL | Status: DC

## 2014-07-16 MED ORDER — ARTIFICIAL TEARS OP OINT
TOPICAL_OINTMENT | OPHTHALMIC | Status: DC | PRN
  Administered 2014-07-16: 1 via OPHTHALMIC

## 2014-07-16 MED ORDER — FENTANYL CITRATE 0.05 MG/ML IJ SOLN
INTRAMUSCULAR | Status: AC
  Filled 2014-07-16: qty 5

## 2014-07-16 MED ORDER — LABETALOL HCL 5 MG/ML IV SOLN
INTRAVENOUS | Status: AC
  Filled 2014-07-16: qty 4

## 2014-07-16 MED ORDER — ACETAMINOPHEN 650 MG RE SUPP: 650.0000 mg | Freq: Four times a day (QID) | RECTAL | Status: DC | PRN

## 2014-07-16 MED ORDER — FENTANYL CITRATE 0.05 MG/ML IJ SOLN
25.0000 ug | INTRAMUSCULAR | Status: DC | PRN
  Administered 2014-07-16 (×3): 50 ug via INTRAVENOUS

## 2014-07-16 MED ORDER — SIMVASTATIN 40 MG PO TABS
40.0000 mg | ORAL_TABLET | Freq: Every day | ORAL | Status: DC
  Administered 2014-07-16 – 2014-07-18 (×3): 40 mg via ORAL
  Filled 2014-07-16 (×4): qty 1

## 2014-07-16 MED ORDER — MIDAZOLAM HCL 2 MG/2ML IJ SOLN
INTRAMUSCULAR | Status: AC
Start: 2014-07-16 — End: 2014-07-16
  Filled 2014-07-16: qty 2

## 2014-07-16 MED ORDER — INSULIN ASPART 100 UNIT/ML ~~LOC~~ SOLN
SUBCUTANEOUS | Status: AC
  Filled 2014-07-16: qty 7

## 2014-07-16 MED ORDER — METFORMIN HCL 500 MG PO TABS
500.0000 mg | ORAL_TABLET | Freq: Two times a day (BID) | ORAL | Status: DC
  Administered 2014-07-16 – 2014-07-19 (×6): 500 mg via ORAL
  Filled 2014-07-16 (×8): qty 1

## 2014-07-16 MED ORDER — ONDANSETRON HCL 4 MG/2ML IJ SOLN
INTRAMUSCULAR | Status: DC | PRN
  Administered 2014-07-16: 4 mg via INTRAVENOUS

## 2014-07-16 MED ORDER — DOCUSATE SODIUM 100 MG PO CAPS
100.0000 mg | ORAL_CAPSULE | Freq: Two times a day (BID) | ORAL | Status: DC
  Administered 2014-07-16 – 2014-07-19 (×6): 100 mg via ORAL
  Filled 2014-07-16 (×8): qty 1

## 2014-07-16 MED ORDER — HYDROMORPHONE HCL PF 1 MG/ML IJ SOLN
0.5000 mg | INTRAMUSCULAR | Status: DC | PRN
  Administered 2014-07-16: 1 mg via INTRAVENOUS
  Administered 2014-07-16 (×2): 0.5 mg via INTRAVENOUS
  Administered 2014-07-16 – 2014-07-17 (×3): 1 mg via INTRAVENOUS
  Filled 2014-07-16 (×4): qty 1

## 2014-07-16 MED ORDER — FLUCONAZOLE IN SODIUM CHLORIDE 400-0.9 MG/200ML-% IV SOLN
400.0000 mg | INTRAVENOUS | Status: AC
  Administered 2014-07-16: 400 mg via INTRAVENOUS
  Filled 2014-07-16: qty 200

## 2014-07-16 MED ORDER — LABETALOL HCL 5 MG/ML IV SOLN
INTRAVENOUS | Status: DC | PRN
  Administered 2014-07-16: 5 mg via INTRAVENOUS

## 2014-07-16 MED ORDER — LIDOCAINE HCL (CARDIAC) 20 MG/ML IV SOLN
INTRAVENOUS | Status: DC | PRN
Start: 2014-07-16 — End: 2014-07-16
  Administered 2014-07-16: 60 mg via INTRAVENOUS

## 2014-07-16 MED ORDER — MIDAZOLAM HCL 5 MG/5ML IJ SOLN
INTRAMUSCULAR | Status: DC | PRN
  Administered 2014-07-16: 1 mg via INTRAVENOUS

## 2014-07-16 MED ORDER — SUCCINYLCHOLINE CHLORIDE 20 MG/ML IJ SOLN
INTRAMUSCULAR | Status: AC
  Filled 2014-07-16: qty 1

## 2014-07-16 MED ORDER — GLYCOPYRROLATE 0.2 MG/ML IJ SOLN
INTRAMUSCULAR | Status: AC
  Filled 2014-07-16: qty 3

## 2014-07-16 MED ORDER — ACETAMINOPHEN 325 MG PO TABS: 650.0000 mg | ORAL_TABLET | Freq: Four times a day (QID) | ORAL | Status: DC | PRN

## 2014-07-16 MED ORDER — PHENOL 1.4 % MT LIQD: 1.0000 | OROMUCOSAL | Status: DC | PRN

## 2014-07-16 MED ORDER — PROPOFOL 10 MG/ML IV BOLUS
INTRAVENOUS | Status: AC
  Filled 2014-07-16: qty 20

## 2014-07-16 MED ORDER — LACTATED RINGERS IV SOLN
INTRAVENOUS | Status: DC | PRN
  Administered 2014-07-16 (×2): via INTRAVENOUS

## 2014-07-16 MED ORDER — ASPIRIN EC 325 MG PO TBEC
325.0000 mg | DELAYED_RELEASE_TABLET | Freq: Two times a day (BID) | ORAL | Status: DC
  Administered 2014-07-17 – 2014-07-19 (×5): 325 mg via ORAL
  Filled 2014-07-16 (×7): qty 1

## 2014-07-16 MED ORDER — ONDANSETRON HCL 4 MG/2ML IJ SOLN: 4.0000 mg | Freq: Once | INTRAMUSCULAR | Status: DC | PRN

## 2014-07-16 MED ORDER — PHENYLEPHRINE 40 MCG/ML (10ML) SYRINGE FOR IV PUSH (FOR BLOOD PRESSURE SUPPORT)
PREFILLED_SYRINGE | INTRAVENOUS | Status: AC
  Filled 2014-07-16: qty 10

## 2014-07-16 MED ORDER — SERTRALINE HCL 50 MG PO TABS
50.0000 mg | ORAL_TABLET | Freq: Every day | ORAL | Status: DC
  Administered 2014-07-16 – 2014-07-18 (×3): 50 mg via ORAL
  Filled 2014-07-16 (×4): qty 1

## 2014-07-16 MED ORDER — OXYCODONE HCL 5 MG PO TABS
5.0000 mg | ORAL_TABLET | Freq: Four times a day (QID) | ORAL | Status: DC | PRN
  Administered 2014-07-16: 10 mg via ORAL
  Administered 2014-07-16 (×2): 5 mg via ORAL
  Administered 2014-07-17 – 2014-07-19 (×6): 10 mg via ORAL
  Filled 2014-07-16 (×8): qty 2

## 2014-07-16 MED ORDER — TRANEXAMIC ACID 100 MG/ML IV SOLN
1000.0000 mg | INTRAVENOUS | Status: AC
  Administered 2014-07-16: 1000 mg via INTRAVENOUS
  Filled 2014-07-16: qty 10

## 2014-07-16 MED ORDER — METOCLOPRAMIDE HCL 5 MG/ML IJ SOLN: 5.0000 mg | Freq: Three times a day (TID) | INTRAMUSCULAR | Status: DC | PRN

## 2014-07-16 SURGICAL SUPPLY — 45 items
BLADE SAW SGTL 18X1.27X75 (BLADE) ×2 IMPLANT
BLADE SURG ROTATE 9660 (MISCELLANEOUS) IMPLANT
CAPT HIP PF COP ×2 IMPLANT
CELLS DAT CNTRL 66122 CELL SVR (MISCELLANEOUS) ×1 IMPLANT
COVER PERINEAL POST (MISCELLANEOUS) ×2 IMPLANT
COVER SURGICAL LIGHT HANDLE (MISCELLANEOUS) ×2 IMPLANT
DRAPE C-ARM 42X72 X-RAY (DRAPES) ×2 IMPLANT
DRAPE STERI IOBAN 125X83 (DRAPES) ×2 IMPLANT
DRAPE U-SHAPE 47X51 STRL (DRAPES) ×6 IMPLANT
DRSG AQUACEL AG ADV 3.5X10 (GAUZE/BANDAGES/DRESSINGS) ×2 IMPLANT
DURAPREP 26ML APPLICATOR (WOUND CARE) ×2 IMPLANT
ELECT BLADE 4.0 EZ CLEAN MEGAD (MISCELLANEOUS)
ELECT CAUTERY BLADE 6.4 (BLADE) ×2 IMPLANT
ELECT REM PT RETURN 9FT ADLT (ELECTROSURGICAL) ×2
ELECTRODE BLDE 4.0 EZ CLN MEGD (MISCELLANEOUS) IMPLANT
ELECTRODE REM PT RTRN 9FT ADLT (ELECTROSURGICAL) ×1 IMPLANT
FACESHIELD WRAPAROUND (MASK) ×6 IMPLANT
GLOVE BIO SURGEON STRL SZ8 (GLOVE) ×10 IMPLANT
GLOVE BIOGEL PI IND STRL 8 (GLOVE) ×2 IMPLANT
GLOVE BIOGEL PI INDICATOR 8 (GLOVE) ×2
GOWN STRL REUS W/ TWL LRG LVL3 (GOWN DISPOSABLE) ×1 IMPLANT
GOWN STRL REUS W/ TWL XL LVL3 (GOWN DISPOSABLE) ×2 IMPLANT
GOWN STRL REUS W/TWL LRG LVL3 (GOWN DISPOSABLE) ×1
GOWN STRL REUS W/TWL XL LVL3 (GOWN DISPOSABLE) ×2
KIT BASIN OR (CUSTOM PROCEDURE TRAY) ×2 IMPLANT
KIT ROOM TURNOVER OR (KITS) ×2 IMPLANT
LINER BOOT UNIVERSAL DISP (MISCELLANEOUS) ×2 IMPLANT
MANIFOLD NEPTUNE II (INSTRUMENTS) ×2 IMPLANT
NS IRRIG 1000ML POUR BTL (IV SOLUTION) ×2 IMPLANT
PACK TOTAL JOINT (CUSTOM PROCEDURE TRAY) ×2 IMPLANT
PAD ARMBOARD 7.5X6 YLW CONV (MISCELLANEOUS) ×4 IMPLANT
RTRCTR WOUND ALEXIS 18CM MED (MISCELLANEOUS) ×2
STAPLER VISISTAT 35W (STAPLE) ×2 IMPLANT
SUT ETHIBOND NAB CT1 #1 30IN (SUTURE) ×6 IMPLANT
SUT VIC AB 0 CT1 27 (SUTURE) ×1
SUT VIC AB 0 CT1 27XBRD ANBCTR (SUTURE) ×1 IMPLANT
SUT VIC AB 1 CT1 27 (SUTURE) ×1
SUT VIC AB 1 CT1 27XBRD ANBCTR (SUTURE) ×1 IMPLANT
SUT VIC AB 2-0 CT1 27 (SUTURE) ×1
SUT VIC AB 2-0 CT1 TAPERPNT 27 (SUTURE) ×1 IMPLANT
SUT VLOC 180 0 24IN GS25 (SUTURE) ×2 IMPLANT
TOWEL OR 17X24 6PK STRL BLUE (TOWEL DISPOSABLE) ×2 IMPLANT
TOWEL OR 17X26 10 PK STRL BLUE (TOWEL DISPOSABLE) ×4 IMPLANT
TRAY FOLEY CATH 14FR (SET/KITS/TRAYS/PACK) ×2 IMPLANT
WATER STERILE IRR 1000ML POUR (IV SOLUTION) IMPLANT

## 2014-07-16 NOTE — Transfer of Care (Signed)
Immediate Anesthesia Transfer of Care Note  Patient: Ashley Sims  Procedure(s) Performed: Procedure(s): TOTAL HIP ARTHROPLASTY ANTERIOR APPROACH (Right)  Patient Location: PACU  Anesthesia Type:General  Level of Consciousness: awake, alert  and oriented  Airway & Oxygen Therapy: Patient Spontanous Breathing and Patient connected to nasal cannula oxygen  Post-op Assessment: Report given to PACU RN and Post -op Vital signs reviewed and stable  Post vital signs: Reviewed and stable  Complications: No apparent anesthesia complications

## 2014-07-16 NOTE — Op Note (Signed)
,PRE-OP DIAGNOSIS:  RIGHT HIP DEGENERATIVE JOINT DISEASE POST-OP DIAGNOSIS:  same PROCEDURE: RIGHT TOTAL HIP ARTHROPLASTY ANTERIOR APPROACH ANESTHESIA:  General SURGEON:  Melrose Nakayama MD ASSISTANT:  Loni Dolly PA-C   INDICATIONS FOR PROCEDURE:  The patient is a 63 y.o. female with a long history of a painful hip.  This has persisted despite multiple conservative measures.  The patient has persisted with pain and dysfunction making rest and activity difficult.  A total hip replacement is offered as surgical treatment.  Informed operative consent was obtained after discussion of possible complications including reaction to anesthesia, infection, neurovascular injury, dislocation, DVT, PE, and death.  The importance of the postoperative rehab program to optimize result was stressed with the patient.  SUMMARY OF FINDINGS AND PROCEDURE:  Under general anesthesia through a anterior approach an the Hana table a right THR was performed.  The patient had severe degenerative change and good bone quality.  We used DePuy components to replace the hip and these were size KLA12 Corail femur capped with a +1 74mm ceramic hip ball.  On the acetabular side we used a size 50 Gription shell with a  plus 4 neutral polyethylene liner.  We did use a hole eliminator.  Loni Dolly PA-C assisted throughout and was invaluable to the completion of the case in that he helped position and retract while I performed the procedure.  He also closed simultaneously to help minimize OR time.  I used fluoroscopy throughout the case to check position of implants and leg lengths and read all of these views myself.  DESCRIPTION OF PROCEDURE:  The patient was taken to the OR suite where general anesthetic was applied.  The patient was then positioned on the Hana table supine.  All bony prominences were appropriately padded.  Prep and drape was then performed in normal sterile fashion.  The patient was given kefzol preoperative antibiotic and  an appropriate time out was performed.  She was also noted to have some redness near the incision site which was fairly mild but was in a skin fold and we elected to dose with IV fluconazole as well after brief consult with ID.  We then took an anterior approach to the right hip.  Dissection was taken through adipose to the tensor fascia lata fascia.  This structure was incised longitudinally and we dissected in the intermuscular interval just medial to this muscle.  Cobra retractors were placed superior and inferior to the femoral neck superficial to the capsule.  A capsular incision was then made and the retractors were placed along the femoral neck.  Xray was brought in to get a good level for the femoral neck cut which was made with an oscillating saw and osteotome.  The femoral head was removed with a corkscrew.  The acetabulum was exposed and some labral tissues were excised. Reaming was taken to the inside wall of the pelvis and sequentially up to 1 mm smaller than the actual component.  A trial of components was done and then the aforementioned acetabular shell was placed in appropriate tilt and anteversion confirmed by fluoroscopy. The liner was placed along with the hole eliminator and attention was turned to the femur.  The leg was brought down and over into adduction and the elevator bar was used to raise the femur up gently in the wound.  The piriformis was released with care taken to preserve the obturator internus attachment and all of the posterior capsule. The femur was reamed and then broached to the  appropriate size.  A trial reduction was done and the aforementioned head and neck assembly gave [place] the best stability in extension with external rotation.  Leg lengths were felt to be about equal by fluoroscopic exam.  The trial components were removed and the wound irrigated.  We then placed the femoral component in appropriate anteversion.  The head was applied to a dry stem neck and the hip again  reduced.  It was again stable in the aforementioned position.  The would was irrigated again followed by re-approximation of anterior capsule with ethibond suture. Tensor fascia was repaired with V-loc suture  followed by subcutaneous closure with #O and #2 undyed vicryl.  Skin was closed with staples followed by a sterile dressing.  EBL and IOF can be obtained from anesthesia records.  DISPOSITION:  The patient was extubated in the OR and taken to PACU in stable condition to be admitted to the Orthopedic Surgery for appropriate post-op care to include perioperative antibiotics and DVT prophylaxis.

## 2014-07-16 NOTE — Progress Notes (Signed)
Utilization review completed.  

## 2014-07-16 NOTE — Plan of Care (Signed)
Problem: Consults Goal: Diagnosis- Total Joint Replacement Primary Total Hip     

## 2014-07-16 NOTE — Evaluation (Signed)
Physical Therapy Evaluation Patient Details Name: Ashley Sims MRN: [id number] DOB: Nov 11, 1950 Today's Date: 07/16/2014   History of Present Illness  Patient is a 63 y/o female s/p R THA. PMH of HTN, DM, depression, HLD, cancer and UTI. WBAT RLE. Anterior approach with no restrictions.   Clinical Impression  Patient presents with post surgical deficits- impaired strength/ROM and pain of RLE s/p R THA. Tolerated short distance ambulation with Min guard assist for safety. Instructed pt in HEP. (+) nausea when initially sitting EOB and post gait training. Pt fatigues easily. HR increased to 115 bpm with minimal ambulation. Encourage sitting in chair daily with RN. Pt would benefit from acute PT and follow up HHPT for transfers, gait, balance and overall safe mobility so pt can maximize independence and return to PLOF.    Follow Up Recommendations Home health PT;Supervision/Assistance - 24 hour    Equipment Recommendations  Rolling walker with 5" wheels    Recommendations for Other Services       Precautions / Restrictions Precautions Precautions: Anterior Hip;Fall Precaution Booklet Issued: No Precaution Comments: No restrictions Restrictions Weight Bearing Restrictions: Yes RLE Weight Bearing: Weight bearing as tolerated      Mobility  Bed Mobility Overal bed mobility: Needs Assistance Bed Mobility: Rolling;Sidelying to Sit;Sit to Supine Rolling: Min guard Sidelying to sit: Min guard;HOB elevated   Sit to supine: Mod assist   General bed mobility comments: Increased time to elevate trunk and mobilize BLEs to EOB. VC's for technique and handplacement, use of rails. Mod A to return to supine due to requiring assist with BLEs.  Transfers Overall transfer level: Needs assistance Equipment used: Rolling walker (2 wheeled) Transfers: Sit to/from Stand Sit to Stand: Min assist         General transfer comment: Stood from EOB x2 with increased time due to (+) nausea. Resolved  within a few minutes. Assist to rise and steady self.   Ambulation/Gait Ambulation/Gait assistance: Min guard Ambulation Distance (Feet): 15 Feet (+ 5') Assistive device: Rolling walker (2 wheeled) Gait Pattern/deviations: Step-through pattern;Antalgic;Decreased stance time - right;Decreased step length - left;Trunk flexed Gait velocity: decreased   General Gait Details: Antalgic gait pattern due to pain in RLE with weightbearing. Decreased speed. Fatigues easily. Unsteady. Able to side step along side bed with right for repositioning.  Stairs            Wheelchair Mobility    Modified Rankin (Stroke Patients Only)       Balance Overall balance assessment: Needs assistance   Sitting balance-Leahy Scale: Fair       Standing balance-Leahy Scale: Poor Standing balance comment: Requires BUE support during static and dynamic standing due to balance deficits and safety.                             Pertinent Vitals/Pain Pain Assessment: 0-10 Pain Score: 4  Pain Location: right hip Pain Descriptors / Indicators: Sore;Aching Pain Intervention(s): Monitored during session;Repositioned;Ice applied;Limited activity within patient's tolerance    Home Living Family/patient expects to be discharged to:: Private residence Living Arrangements: Spouse/significant other Available Help at Discharge: Family;Available 24 hours/day Type of Home: House Home Access: Level entry     Home Layout: One level Home Equipment: Walker - 4 wheels      Prior Function Level of Independence: Needs assistance   Gait / Transfers Assistance Needed: Ambulating with rollator PTA. Reports difficulty with LLE - needs to get left hip done soon.  ADL's / Homemaking Assistance Needed: Required assist with donning socks/shoes. Not doing any cooking/cleaning.         Hand Dominance        Extremity/Trunk Assessment   Upper Extremity Assessment: Defer to OT evaluation            Lower Extremity Assessment: RLE deficits/detail;Generalized weakness RLE Deficits / Details: Limited AROM hip flexion secondary to post surgical deficits. Able to perform LAQ.       Communication   Communication: No difficulties  Cognition Arousal/Alertness: Awake/alert Behavior During Therapy: WFL for tasks assessed/performed Overall Cognitive Status: Within Functional Limits for tasks assessed                      General Comments General comments (skin integrity, edema, etc.): Surgical site, clean, dry and intact.     Exercises Total Joint Exercises Ankle Circles/Pumps: Both;10 reps;Seated Quad Sets: Both;10 reps;Seated Gluteal Sets: Both;10 reps;Supine Long Arc Quad: Both;10 reps;Seated      Assessment/Plan    PT Assessment Patient needs continued PT services  PT Diagnosis Generalized weakness;Acute pain;Difficulty walking   PT Problem List Decreased strength;Pain;Decreased activity tolerance;Decreased balance;Decreased safety awareness;Decreased mobility;Decreased skin integrity  PT Treatment Interventions Balance training;DME instruction;Gait training;Patient/family education;Functional mobility training;Therapeutic activities;Therapeutic exercise   PT Goals (Current goals can be found in the Care Plan section) Acute Rehab PT Goals Patient Stated Goal: to get moving better PT Goal Formulation: With patient Time For Goal Achievement: 07/30/14 Potential to Achieve Goals: Good    Frequency 7X/week   Barriers to discharge        Co-evaluation               End of Session Equipment Utilized During Treatment: Gait belt Activity Tolerance: Patient limited by fatigue (nausea.) Patient left: in bed;with call bell/phone within reach;with bed alarm set;with family/visitor present Nurse Communication: Mobility status;Weight bearing status         Time: 4481-8563 PT Time Calculation (min): 25 min   Charges:   PT Evaluation $Initial PT  Evaluation Tier I: 1 Procedure PT Treatments $Therapeutic Activity: 8-22 mins   PT G CodesCandy Sledge A 07/16/2014, 4:55 PM Candy Sledge, [place], DPT [phone]

## 2014-07-16 NOTE — Anesthesia Procedure Notes (Signed)
Procedure Name: Intubation Date/Time: 07/16/2014 7:38 AM Performed by: Susa Loffler Pre-anesthesia Checklist: Patient identified, Timeout performed, Emergency Drugs available, Suction available and Patient being monitored Patient Re-evaluated:Patient Re-evaluated prior to inductionOxygen Delivery Method: Circle system utilized Preoxygenation: Pre-oxygenation with 100% oxygen Intubation Type: IV induction Ventilation: Mask ventilation without difficulty and Oral airway inserted - appropriate to patient size Laryngoscope Size: Mac and 3 Grade View: Grade II Tube type: Oral Tube size: 7.0 mm Number of attempts: 1 Airway Equipment and Method: Stylet and Oral airway Placement Confirmation: ETT inserted through vocal cords under direct vision,  positive ETCO2 and breath sounds checked- equal and bilateral Secured at: 22 cm Tube secured with: Tape Dental Injury: Teeth and Oropharynx as per pre-operative assessment

## 2014-07-16 NOTE — Anesthesia Preprocedure Evaluation (Signed)
Anesthesia Evaluation  Patient identified by MRN, date of birth, ID band Patient awake    Reviewed: Allergy & Precautions, H&P , NPO status , Patient's Chart, lab work & pertinent test results  Airway Mallampati: II TM Distance: >3 FB Neck ROM: Full    Dental  (+) Teeth Intact, Dental Advisory Given   Pulmonary former smoker,  breath sounds clear to auscultation        Cardiovascular hypertension, Rhythm:Regular Rate:Normal     Neuro/Psych    GI/Hepatic   Endo/Other  diabetes  Renal/GU      Musculoskeletal   Abdominal (+) + obese,   Peds  Hematology   Anesthesia Other Findings   Reproductive/Obstetrics                           Anesthesia Physical Anesthesia Plan  ASA: III  Anesthesia Plan: General   Post-op Pain Management:    Induction: Intravenous  Airway Management Planned: Oral ETT  Additional Equipment:   Intra-op Plan:   Post-operative Plan: Extubation in OR  Informed Consent: I have reviewed the patients History and Physical, chart, labs and discussed the procedure including the risks, benefits and alternatives for the proposed anesthesia with the patient or authorized representative who has indicated his/her understanding and acceptance.   Dental advisory given  Plan Discussed with: CRNA and Anesthesiologist  Anesthesia Plan Comments: (Rheumatoid arthritis with R. Hip involvement Type 2 DM glucose 146 Hypertension H/O uterine Ca S/P TAH and Chemo Rx with Radiation 2013 Obesity GERD   Plan GA with oral ETT)        Anesthesia Quick Evaluation

## 2014-07-16 NOTE — Progress Notes (Signed)
.  5mg  dilaudid iv given in room per request Darla Lesches receiving nurse not scanned no wow in room at present

## 2014-07-16 NOTE — Anesthesia Postprocedure Evaluation (Signed)
  Anesthesia Post-op Note  Patient: Ashley Sims  Procedure(s) Performed: Procedure(s): TOTAL HIP ARTHROPLASTY ANTERIOR APPROACH (Right)  Patient Location: PACU  Anesthesia Type:General  Level of Consciousness: awake, alert  and oriented  Airway and Oxygen Therapy: Patient Spontanous Breathing and Patient connected to nasal cannula oxygen  Post-op Pain: mild  Post-op Assessment: Post-op Vital signs reviewed, Patient's Cardiovascular Status Stable, Respiratory Function Stable, Patent Airway and Pain level controlled  Post-op Vital Signs: stable  Last Vitals:  Filed Vitals:   07/16/14 1315  BP:   Pulse: 84  Temp:   Resp:     Complications: No apparent anesthesia complications

## 2014-07-16 NOTE — Interval H&P Note (Signed)
History and Physical Interval Note:  07/16/2014 7:19 AM  Ashley Sims  has presented today for surgery, with the diagnosis of RIGHT HIP DEGENERATIVE JOINT DISEASE  The various methods of treatment have been discussed with the patient and family. After consideration of risks, benefits and other options for treatment, the patient has consented to  Procedure(s): TOTAL HIP ARTHROPLASTY ANTERIOR APPROACH (Right) as a surgical intervention .  The patient's history has been reviewed, patient examined, no change in status, stable for surgery.  I have reviewed the patient's chart and labs.  Questions were answered to the patient's satisfaction.     Chrysta Fulcher G

## 2014-07-17 ENCOUNTER — Encounter (HOSPITAL_COMMUNITY): Payer: Self-pay | Admitting: General Practice

## 2014-07-17 LAB — BASIC METABOLIC PANEL
Anion gap: 9 (ref 5–15)
BUN: 9 mg/dL (ref 6–23)
CALCIUM: 9.6 mg/dL (ref 8.4–10.5)
CO2: 30 mEq/L (ref 19–32)
CREATININE: 0.64 mg/dL (ref 0.50–1.10)
Chloride: 95 mEq/L — ABNORMAL LOW (ref 96–112)
GLUCOSE: 189 mg/dL — AB (ref 70–99)
POTASSIUM: 5.3 meq/L (ref 3.7–5.3)
Sodium: 134 mEq/L — ABNORMAL LOW (ref 137–147)

## 2014-07-17 LAB — GLUCOSE, CAPILLARY
GLUCOSE-CAPILLARY: 198 mg/dL — AB (ref 70–99)
GLUCOSE-CAPILLARY: 179 mg/dL — AB (ref 70–99)
Glucose-Capillary: 181 mg/dL — ABNORMAL HIGH (ref 70–99)
Glucose-Capillary: 183 mg/dL — ABNORMAL HIGH (ref 70–99)

## 2014-07-17 LAB — CBC
HEMATOCRIT: 33.4 % — AB (ref 36.0–46.0)
HEMOGLOBIN: 10.8 g/dL — AB (ref 12.0–15.0)
MCH: 30.6 pg (ref 26.0–34.0)
MCHC: 32.3 g/dL (ref 30.0–36.0)
MCV: 94.6 fL (ref 78.0–100.0)
Platelets: 259 10*3/uL (ref 150–400)
RBC: 3.53 MIL/uL — ABNORMAL LOW (ref 3.87–5.11)
RDW: 15 % (ref 11.5–15.5)
WBC: 10.3 10*3/uL (ref 4.0–10.5)

## 2014-07-17 NOTE — Progress Notes (Signed)
Physical Therapy Treatment Patient Details Name: Ashley Sims MRN: [id number] DOB: 06-Feb-1951 Today's Date: 07/17/2014    History of Present Illness Patient is a 63 y/o female s/p R THA. PMH of HTN, DM, depression, HLD, cancer and UTI. WBAT RLE. Anterior approach with no restrictions.    PT Comments    Patient able to ambulate slightly more this session but continues to be somewhat self limiting with mobility. Patient was able to incorporate some more LE therex this afternoon. Continue with current POC  Follow Up Recommendations  Home health PT;Supervision/Assistance - 24 hour     Equipment Recommendations  Rolling walker with 5" wheels    Recommendations for Other Services       Precautions / Restrictions Precautions Precautions: Anterior Hip;Fall Precaution Comments: No restrictions Restrictions RLE Weight Bearing: Weight bearing as tolerated    Mobility  Bed Mobility Overal bed mobility: Needs Assistance     Sidelying to sit: Min assist       General bed mobility comments: Patien sitting up in recliner before and after sessoin for OT  Transfers Overall transfer level: Needs assistance Equipment used: Rolling walker (2 wheeled)   Sit to Stand: Min guard         General transfer comment: Cues for hand placement, requires increased time for prep  Ambulation/Gait Ambulation/Gait assistance: Min guard Ambulation Distance (Feet): 35 Feet Assistive device: Rolling walker (2 wheeled) Gait Pattern/deviations: Step-to pattern;Decreased step length - right;Decreased stance time - left Gait velocity: decreased   General Gait Details: Antalgic gait pattern due to pain in RLE with weightbearing. Decreased speed. Fatigues easily. Unsteady. Patient tends to lean over top of walker with max cues to prevent   Stairs            Wheelchair Mobility    Modified Rankin (Stroke Patients Only)       Balance                                     Cognition Arousal/Alertness: Awake/alert Behavior During Therapy: WFL for tasks assessed/performed Overall Cognitive Status: Within Functional Limits for tasks assessed                      Exercises Total Joint Exercises Quad Sets: Both;10 reps;Seated Heel Slides: AAROM;10 reps Hip ABduction/ADduction: AAROM;Right;10 reps Long Arc Quad: Seated;5 reps;Right    General Comments        Pertinent Vitals/Pain Pain Assessment: 0-10 Pain Score: 8  Pain Location:  Rhip Pain Descriptors / Indicators: Aching Pain Intervention(s): Monitored during session    Home Living                      Prior Function            PT Goals (current goals can now be found in the care plan section) Progress towards PT goals: Progressing toward goals    Frequency  7X/week    PT Plan Current plan remains appropriate    Co-evaluation             End of Session Equipment Utilized During Treatment: Gait belt Activity Tolerance: Patient tolerated treatment well Patient left: in chair;with call bell/phone within reach;with family/visitor present     Time: 8756-4332 PT Time Calculation (min): 25 min  Charges:  $Gait Training: 8-22 mins $Therapeutic Exercise: 8-22 mins  G Codes:      Jacqualyn Posey 07/17/2014, 2:03 PM 07/17/2014 Jacqualyn Posey PTA [phone] pager [phone] office

## 2014-07-17 NOTE — Progress Notes (Signed)
Physical Therapy Treatment Patient Details Name: Ashley Sims MRN: [id number] DOB: 27-Jul-1951 Today's Date: 07/17/2014    History of Present Illness Patient is a 62 y/o female s/p R THA. PMH of HTN, DM, depression, HLD, cancer and UTI. WBAT RLE. Anterior approach with no restrictions.    PT Comments    Patient limited by fatigue and somewhat self limiting. Patient is planning to DC home tomorrow per daughter. Will attempt hall ambulation this afternoon.   Follow Up Recommendations  Home health PT;Supervision/Assistance - 24 hour     Equipment Recommendations  Rolling walker with 5" wheels    Recommendations for Other Services       Precautions / Restrictions Precautions Precautions: Anterior Hip;Fall Precaution Comments: No restrictions Restrictions Weight Bearing Restrictions: Yes RLE Weight Bearing: Weight bearing as tolerated    Mobility  Bed Mobility Overal bed mobility: Needs Assistance     Sidelying to sit: Min assist       General bed mobility comments: min A for LE and increased time. Patient with heavy reliance on rails this session  Transfers Overall transfer level: Needs assistance Equipment used: Rolling walker (2 wheeled)   Sit to Stand: Min assist         General transfer comment: Min A x2 with cues for hand placement and to ensure positioning before standing  Ambulation/Gait Ambulation/Gait assistance: Min guard Ambulation Distance (Feet): 25 Feet Assistive device: Rolling walker (2 wheeled) Gait Pattern/deviations: Step-to pattern;Decreased stance time - right;Decreased step length - left;Trunk flexed Gait velocity: decreased   General Gait Details: Antalgic gait pattern due to pain in RLE with weightbearing. Decreased speed. Fatigues easily. Unsteady. Patient tends to lean over top of walker with max cues to prevent   Stairs            Wheelchair Mobility    Modified Rankin (Stroke Patients Only)       Balance                                    Cognition Arousal/Alertness: Awake/alert Behavior During Therapy: WFL for tasks assessed/performed Overall Cognitive Status: Within Functional Limits for tasks assessed                      Exercises Total Joint Exercises Quad Sets: Both;10 reps;Seated Long Arc Quad: Seated;5 reps;Right    General Comments        Pertinent Vitals/Pain Pain Score: 8  Pain Location: R hip  Pain Descriptors / Indicators: Aching Pain Intervention(s): Monitored during session    Home Living Family/patient expects to be discharged to:: Private residence Living Arrangements: Spouse/significant other                  Prior Function            PT Goals (current goals can now be found in the care plan section) Progress towards PT goals: Progressing toward goals    Frequency  7X/week    PT Plan Current plan remains appropriate    Co-evaluation             End of Session Equipment Utilized During Treatment: Gait belt Activity Tolerance: Patient limited by fatigue;Patient limited by pain Patient left: in chair;with call bell/phone within reach;with family/visitor present     Time: 3500-9381 PT Time Calculation (min): 24 min  Charges:  $Gait Training: 8-22 mins $Therapeutic Exercise: 8-22 mins  G Codes:      Jacqualyn Posey 07/17/2014, 10:21 AM 07/17/2014 Jacqualyn Posey PTA [phone] pager [phone] office

## 2014-07-17 NOTE — Progress Notes (Signed)
Occupational Therapy Evaluation Patient Details Name: Ashley Sims MRN: [id number] DOB: May 14, 1951 Today's Date: 07/17/2014    History of Present Illness Patient is a 63 y/o female s/p R THA. PMH of HTN, DM, depression, HLD, cancer and UTI. WBAT RLE. Anterior approach with no restrictions.   Clinical Impression   Patient presents with decreased ADL independence and safety s/p R THR. Will benefit from skilled OT to maximize functional independence prior to d/c home.    Follow Up Recommendations  Supervision/Assistance - 24 hour;Home health OT    Equipment Recommendations  3 in 1 bedside comode    Recommendations for Other Services       Precautions / Restrictions Precautions Precautions: Anterior Hip;Fall Precaution Comments: No restrictions Restrictions RLE Weight Bearing: Weight bearing as tolerated      Mobility Bed Mobility               General bed mobility comments: Patien sitting up in recliner before and after sessoin for OT  Transfers Overall transfer level: Needs assistance Equipment used: Rolling walker (2 wheeled)   Sit to Stand: Min guard         General transfer comment: Cues for hand placement, requires increased time for prep    Balance                                            ADL Overall ADL's : Needs assistance/impaired Eating/Feeding: Independent   Grooming: Set up   Upper Body Bathing: Set up   Lower Body Bathing: Moderate assistance   Upper Body Dressing : Set up   Lower Body Dressing: Moderate assistance               Functional mobility during ADLs:  (pt deferred -- just worked with PT including toileting) General ADL Comments: Patient just walked with PT and fatigued/in pain. Toileted with PT -- PT reports she needed min guard A for BSC transfer and dependent with toilet hygiene. Reviewed AE with pt. She has all at home. Educated pt on techniques for LB dressing. Husband present and can A too. Will  practice walk-in shower transfer next session and address toilet hygiene.     Vision                     Perception     Praxis      Pertinent Vitals/Pain Pain Assessment: 0-10 Pain Score: 5  Pain Location: R hip Pain Descriptors / Indicators: Aching;Constant Pain Intervention(s): Limited activity within patient's tolerance;Monitored during session     Hand Dominance Right   Extremity/Trunk Assessment Upper Extremity Assessment Upper Extremity Assessment: Overall WFL for tasks assessed   Lower Extremity Assessment Lower Extremity Assessment: Defer to PT evaluation       Communication Communication Communication: No difficulties   Cognition Arousal/Alertness: Awake/alert Behavior During Therapy: WFL for tasks assessed/performed Overall Cognitive Status: Within Functional Limits for tasks assessed                     General Comments       Exercises       Shoulder Instructions      Home Living Family/patient expects to be discharged to:: Private residence Living Arrangements: Spouse/significant other Available Help at Discharge: Family;Available 24 hours/day Type of Home: House Home Access: Level entry     Home Layout: One level  Bathroom Shower/Tub: Occupational psychologist: Standard     Home Equipment: Environmental consultant - 4 wheels;Bedside commode;Walker - 2 wheels;Shower seat - built in;Adaptive equipment;Grab bars - toilet;Grab bars - tub/shower Adaptive Equipment: Reacher;Sock aid;Long-handled shoe horn;Long-handled sponge        Prior Functioning/Environment Level of Independence: Needs assistance  Gait / Transfers Assistance Needed: Ambulating with rollator PTA. Reports difficulty with LLE - needs to get left hip done soon. ADL's / Homemaking Assistance Needed: Required assist with donning socks/shoes. Not doing any cooking/cleaning.         OT Diagnosis: Acute pain   OT Problem List: Pain;Decreased knowledge of use of DME  or AE;Decreased activity tolerance   OT Treatment/Interventions: Self-care/ADL training;DME and/or AE instruction;Therapeutic activities;Patient/family education    OT Goals(Current goals can be found in the care plan section) Acute Rehab OT Goals Patient Stated Goal: to get moving better OT Goal Formulation: With patient Time For Goal Achievement: 07/31/14 Potential to Achieve Goals: Good  OT Frequency: Min 2X/week   Barriers to D/C:            Co-evaluation              End of Session    Activity Tolerance: Patient limited by pain Patient left: in chair;with call bell/phone within reach;with family/visitor present   Time: 1350-1411 OT Time Calculation (min): 21 min Charges:  OT General Charges $OT Visit: 1 Procedure OT Evaluation $Initial OT Evaluation Tier I: 1 Procedure OT Treatments $Self Care/Home Management : 8-22 mins G-Codes:    [name] 2014/08/08, 2:22 PM

## 2014-07-17 NOTE — Progress Notes (Signed)
CARE MANAGEMENT NOTE 07/17/2014  Patient:  Ashley Sims, Ashley Sims   Account Number:  [id number]  Date Initiated:  07/17/2014  Documentation initiated by:  [facility]  Subjective/Objective Assessment:   admitted with s/p rt THA     Action/Plan:   Pt/Ot evals-recommended HHPT   Anticipated DC Date:  07/18/2014   Anticipated DC Plan:  [place]  CM consult      [facility] Choice  [place]   Choice offered to / List presented to:  C-1 Patient   DME arranged  3-N-1  [place]      DME agency  [facility] arranged  [facility]      [facility] agency  [place]   Status of service:  Completed, signed off Medicare Important Message given?   (If response is "NO", the following Medicare IM given date fields will be blank) Date Medicare IM given:   Medicare IM given by:   Date Additional Medicare IM given:   Additional Medicare IM given by:    Discharge Disposition:  [place]  Per UR Regulation:    If discussed at Long Length of Stay Meetings, dates discussed:    Comments:  07/17/14 Set up by MD office with [facility] for HHPT. Received call from [facility] with [facility] requesting HHPT order and H and P be faxed to [phone]. Spoke with patient, no change in discharge plan. Faxed requested information. Rolling walker and 3N1 delivered to patient's room by [employer]. Patient will have family to assist after discharge. Fuller Plan RN, [employer]

## 2014-07-17 NOTE — Progress Notes (Signed)
Subjective: 1 Day Post-Op Procedure(s) (LRB): TOTAL HIP ARTHROPLASTY ANTERIOR APPROACH (Right)  Activity level:  WBAT Diet tolerance:  Eating well Voiding:  OK Patient reports pain as moderate.    Objective: Vital signs in last 24 hours: Temp:  [98 F (36.7 C)-98.4 F (36.9 C)] 98 F (36.7 C) (09/16 0551) Pulse Rate:  [84-96] 96 (09/16 0551) Resp:  [11-18] 16 (09/16 0551) BP: (134-160)/(51-73) 149/63 mmHg (09/16 0551) SpO2:  [94 %-100 %] 96 % (09/16 0551)  Labs:  Recent Labs  07/17/14 0531  HGB 10.8*    Recent Labs  07/17/14 0531  WBC 10.3  RBC 3.53*  HCT 33.4*  PLT 259    Recent Labs  07/17/14 0531  NA 134*  K 5.3  CL 95*  CO2 30  BUN 9  CREATININE 0.64  GLUCOSE 189*  CALCIUM 9.6   No results found for this basename: LABPT, INR,  in the last 72 hours  Physical Exam:  Neurologically intact ABD soft Neurovascular intact Sensation intact distally Intact pulses distally Dorsiflexion/Plantar flexion intact Incision: dressing C/D/I No cellulitis present Compartment soft  Assessment/Plan:  1 Day Post-Op Procedure(s) (LRB): TOTAL HIP ARTHROPLASTY ANTERIOR APPROACH (Right) Advance diet Up with therapy D/C IV fluids Plan for discharge tomorrow Discharge home with home health if cleared by PT and doing well. Continue ASA 325mg  BID x 4 weeks for DVT prophylaxis. F/U in office in 2 weeks post-op.     Revis Whalin, Larwance Sachs 07/17/2014, 7:45 AM

## 2014-07-18 ENCOUNTER — Encounter (HOSPITAL_COMMUNITY): Payer: Self-pay | Admitting: Orthopaedic Surgery

## 2014-07-18 LAB — CBC
HCT: 30.1 % — ABNORMAL LOW (ref 36.0–46.0)
Hemoglobin: 9.8 g/dL — ABNORMAL LOW (ref 12.0–15.0)
MCH: 30.5 pg (ref 26.0–34.0)
MCHC: 32.6 g/dL (ref 30.0–36.0)
MCV: 93.8 fL (ref 78.0–100.0)
Platelets: 226 10*3/uL (ref 150–400)
RBC: 3.21 MIL/uL — ABNORMAL LOW (ref 3.87–5.11)
RDW: 15.1 % (ref 11.5–15.5)
WBC: 10.8 10*3/uL — ABNORMAL HIGH (ref 4.0–10.5)

## 2014-07-18 LAB — BASIC METABOLIC PANEL
Anion gap: 13 (ref 5–15)
BUN: 8 mg/dL (ref 6–23)
CALCIUM: 9.5 mg/dL (ref 8.4–10.5)
CO2: 27 meq/L (ref 19–32)
CREATININE: 0.65 mg/dL (ref 0.50–1.10)
Chloride: 94 mEq/L — ABNORMAL LOW (ref 96–112)
GFR calc non Af Amer: >90 mL/min (ref >90)
Glucose, Bld: 173 mg/dL — ABNORMAL HIGH (ref 70–99)
Potassium: 4 mEq/L (ref 3.7–5.3)
SODIUM: 134 meq/L — AB (ref 137–147)

## 2014-07-18 LAB — GLUCOSE, CAPILLARY
GLUCOSE-CAPILLARY: 202 mg/dL — AB (ref 70–99)
Glucose-Capillary: 158 mg/dL — ABNORMAL HIGH (ref 70–99)
Glucose-Capillary: 174 mg/dL — ABNORMAL HIGH (ref 70–99)
Glucose-Capillary: 161 mg/dL — ABNORMAL HIGH (ref 70–99)

## 2014-07-18 NOTE — Progress Notes (Signed)
Physical Therapy Treatment Patient Details Name: Ashley Sims MRN: [id number] DOB: 26-Feb-1951 Today's Date: 07/18/2014    History of Present Illness Patient is a 62 y/o female s/p R THA. PMH of HTN, DM, depression, HLD, cancer and UTI. WBAT RLE. Anterior approach with no restrictions.    PT Comments    Patient still limited by fatigue and progressing slowly with ambulation. Per husband and patient, she was not moving much at home. Patient stated the pain was better today but she just feels "wore out". Will continue to follow  Follow Up Recommendations  Home health PT;Supervision/Assistance - 24 hour     Equipment Recommendations  Rolling walker with 5" wheels    Recommendations for Other Services       Precautions / Restrictions Precautions Precautions: Anterior Hip;Fall Precaution Comments: No restrictions Restrictions Weight Bearing Restrictions: Yes RLE Weight Bearing: Weight bearing as tolerated    Mobility  Bed Mobility Overal bed mobility: Needs Assistance   Rolling: Min guard Sidelying to sit: Min guard       General bed mobility comments: Cues for postioning. Able to complete without assitance but with use of rails. Will have assistance from her husband at home.   Transfers Overall transfer level: Needs assistance Equipment used: Rolling walker (2 wheeled)   Sit to Stand: Min guard         General transfer comment: Cues for hand placement, requires increased time for prep  Ambulation/Gait Ambulation/Gait assistance: Min guard Ambulation Distance (Feet): 40 Feet Assistive device: Rolling walker (2 wheeled) Gait Pattern/deviations: Step-to pattern;Decreased step length - right;Decreased step length - left Gait velocity: decreased   General Gait Details: Patient continues to have diffculty and increased effort with advancing R LE. Cues for upright posture as she tends to push RW leaned over despite cueing    Stairs            Wheelchair  Mobility    Modified Rankin (Stroke Patients Only)       Balance                                    Cognition Arousal/Alertness: Awake/alert Behavior During Therapy: WFL for tasks assessed/performed Overall Cognitive Status: Within Functional Limits for tasks assessed                      Exercises Total Joint Exercises Quad Sets: Both;10 reps;Seated Heel Slides: AAROM;10 reps;Right Hip ABduction/ADduction: AAROM;Right;10 reps Long Arc Quad: AAROM;Right;10 reps    General Comments        Pertinent Vitals/Pain Pain Score: 3  Pain Location: R hip Pain Descriptors / Indicators: Aching Pain Intervention(s): Limited activity within patient's tolerance    Home Living                      Prior Function            PT Goals (current goals can now be found in the care plan section) Progress towards PT goals:  (slowly)    Frequency  7X/week    PT Plan Current plan remains appropriate    Co-evaluation             End of Session Equipment Utilized During Treatment: Gait belt Activity Tolerance: Patient tolerated treatment well;Patient limited by fatigue Patient left: in chair;with call bell/phone within reach;with family/visitor present     Time: 0623-7628 PT Time Calculation (min): 28  min  Charges:  $Gait Training: 8-22 mins $Therapeutic Exercise: 8-22 mins                    G Codes:      Ashley Sims 07/18/2014, 9:11 AM 07/18/2014 Ashley Sims PTA [phone] pager [phone] office

## 2014-07-18 NOTE — Progress Notes (Signed)
Subjective: 2 Days Post-Op Procedure(s) (LRB): TOTAL HIP ARTHROPLASTY ANTERIOR APPROACH (Right)  Activity level:  wbat Diet tolerance:  Eating well Voiding:  ok Patient reports pain as mild and moderate.    Objective: Vital signs in last 24 hours: Temp:  [98.7 F (37.1 C)] 98.7 F (37.1 C) (09/17 0637) Pulse Rate:  [105-107] 105 (09/17 0637) Resp:  [18-19] 18 (09/17 0637) BP: (126-128)/(44-52) 126/52 mmHg (09/17 0637) SpO2:  [91 %-94 %] 91 % (09/17 0637)  Labs:  Recent Labs  07/17/14 0531 07/18/14 0502  HGB 10.8* 9.8*    Recent Labs  07/17/14 0531 07/18/14 0502  WBC 10.3 10.8*  RBC 3.53* 3.21*  HCT 33.4* 30.1*  PLT 259 226    Recent Labs  07/17/14 0531 07/18/14 0502  NA 134* 134*  K 5.3 4.0  CL 95* 94*  CO2 30 27  BUN 9 8  CREATININE 0.64 0.65  GLUCOSE 189* 173*  CALCIUM 9.6 9.5   No results found for this basename: LABPT, INR,  in the last 72 hours  Physical Exam:  Neurologically intact ABD soft Neurovascular intact Sensation intact distally Intact pulses distally Dorsiflexion/Plantar flexion intact Incision: dressing C/D/I No cellulitis present Compartment soft  Assessment/Plan:  2 Days Post-Op Procedure(s) (LRB): TOTAL HIP ARTHROPLASTY ANTERIOR APPROACH (Right) Advance diet Up with therapy Plan for discharge tomorrow Discharge to SNF since patient is not progressing as well as we would like with PT Continue ASA 325mg  BID x 4 weeks post op. Follow up in office 2 weeks post op.    Ashley Sims, Ashley Sims 07/18/2014, 2:10 PM

## 2014-07-18 NOTE — Progress Notes (Signed)
Physical Therapy Treatment Patient Details Name: Ashley Sims MRN: [id number] DOB: [birth year] Today's Date: 07/18/2014    History of Present Illness Patient is a 63 y/o female s/p R THA. PMH of HTN, DM, depression, HLD, cancer and UTI. WBAT RLE. Anterior approach with no restrictions.    PT Comments    Patient not making progress this session with ambulation and is very fatigued with small bouts of mobility. After speaking with patient and her husband, patient would benefit from continued therapy at SNF to increase mobility and safety prior to DC home. Husband cannot physically assist patient at home as he has to use a cane himself. Safest place at this time is SNF for continued therapies. Case Manager, CSW and MD made aware and in agreement  Follow Up Recommendations  Supervision/Assistance - 24 hour;SNF     Equipment Recommendations  Rolling walker with 5" wheels    Recommendations for Other Services       Precautions / Restrictions Precautions Precautions: Anterior Hip;Fall Precaution Comments: No restrictions Restrictions RLE Weight Bearing: Weight bearing as tolerated    Mobility  Bed Mobility       Sidelying to sit: Min assist       General bed mobility comments: Min A for LEs. Patient with HOB elevated and use of rails. increased time and effort  Transfers Overall transfer level: Needs assistance Equipment used: Rolling walker (2 wheeled)   Sit to Stand: Min guard         General transfer comment: Cues for hand placement, requires increased time for prep  Ambulation/Gait Ambulation/Gait assistance: Min assist Ambulation Distance (Feet): 20 Feet Assistive device: Rolling walker (2 wheeled) Gait Pattern/deviations: Step-to pattern;Decreased step length - left;Decreased step length - right Gait velocity: decreased   General Gait Details: Patient continues to have diffculty and increased effort with advancing R LE. Cues for upright posture as she tends to  push RW leaned over despite cueing. Very limtied by fatique   Stairs            Wheelchair Mobility    Modified Rankin (Stroke Patients Only)       Balance                                    Cognition Arousal/Alertness: Awake/alert Behavior During Therapy: WFL for tasks assessed/performed Overall Cognitive Status: Within Functional Limits for tasks assessed                      Exercises      General Comments        Pertinent Vitals/Pain Pain Score: 5  Pain Location: R hip Pain Descriptors / Indicators: Aching Pain Intervention(s): Limited activity within patient's tolerance    Home Living                      Prior Function            PT Goals (current goals can now be found in the care plan section) Progress towards PT goals: Not progressing toward goals - comment    Frequency  7X/week    PT Plan Discharge plan needs to be updated    Co-evaluation             End of Session Equipment Utilized During Treatment: Gait belt Activity Tolerance: Patient tolerated treatment well;Patient limited by fatigue Patient left: in chair;with call bell/phone within reach;with  family/visitor present     Time: 1316-1340 PT Time Calculation (min): 24 min  Charges:  $Gait Training: 8-22 mins $Therapeutic Activity: 8-22 mins                    G Codes:      Ashley Sims 07/18/2014, 2:03 PM  07/18/2014 Ashley Sims PTA [phone] pager [phone] office

## 2014-07-18 NOTE — Progress Notes (Signed)
Occupational Therapy Treatment Patient Details Name: Ashley Sims MRN: [id number] DOB: 11/12/1950 Today's Date: 07/18/2014    History of present illness Patient is a 63 y/o female s/p R THA. PMH of HTN, DM, depression, HLD, cancer and UTI. WBAT RLE. Anterior approach with no restrictions.   OT comments  Pt. Participating in skilled OT but continues to move slow secondary to pain and fatigue.  Able to ambulate to/from bathroom but required cues for walker safety and management.  After discussion with pt. And spouse they agree that patient would benefit from continued therapy prior to d/c home to ensure safety at home.    Follow Up Recommendations  SNF    Equipment Recommendations  3 in 1 bedside comode    Recommendations for Other Services      Precautions / Restrictions Precautions Precautions: Anterior Hip;Fall Precaution Comments: No restrictions Restrictions RLE Weight Bearing: Weight bearing as tolerated       Mobility Bed Mobility       Sidelying to sit: Min assist       General bed mobility comments: in recliner upon arrival.  Transfers Overall transfer level: Needs assistance Equipment used: Rolling walker (2 wheeled) Transfers: Sit to/from Stand Sit to Stand: Min guard         General transfer comment: Cues for hand placement, requires increased time for prep    Balance                                   ADL Overall ADL's : Needs assistance/impaired     Grooming: Wash/dry hands;Wash/dry face;Min guard;Standing;Cueing for safety;Cueing for compensatory techniques           Upper Body Dressing : Maximal assistance;Sitting       Toilet Transfer: Minimal assistance;Cueing for safety;Cueing for sequencing;Ambulation;BSC;Regular Toilet;Grab bars;RW   Toileting- Clothing Manipulation and Hygiene: Min guard;Cueing for safety;Cueing for compensatory techniques;Sit to/from stand;Sitting/lateral lean         General ADL Comments:  amb. with increased time and requires cues to maintain upright posture for walker safety.  max a for LB dressing to don underwear.  reviewed benefits of A/E for LB dressing.  peri care performed in sitting and also in standing with UE support on walker.        Vision                     Perception     Praxis      Cognition   Behavior During Therapy: Flat affect Overall Cognitive Status: Within Functional Limits for tasks assessed                       Extremity/Trunk Assessment               Exercises     Shoulder Instructions       General Comments  flat affect but pt. States she is not a talker and takes after her father who also did not talk a lot or show expressive emotion.  Eager to get home to her dog missy.  Also has a 65 year old grandson.    Pertinent Vitals/ Pain       Pain Assessment: No/denies pain Pain Score: 5  Pain Location: R hip Pain Descriptors / Indicators: Aching Pain Intervention(s): Limited activity within patient's tolerance  Home Living  Prior Functioning/Environment              Frequency Min 2X/week     Progress Toward Goals  OT Goals(current goals can now be found in the care plan section)  Progress towards OT goals: Progressing toward goals     Plan Discharge plan needs to be updated    Co-evaluation                 End of Session Equipment Utilized During Treatment: Gait belt;Rolling walker   Activity Tolerance Patient tolerated treatment well   Patient Left in chair;with call bell/phone within reach;with nursing/sitter in room;with family/visitor present   Nurse Communication          Time: 1340-1400 OT Time Calculation (min): 20 min  Charges: OT General Charges $OT Visit: 1 Procedure OT Treatments $Self Care/Home Management : 8-22 mins   Janice Coffin, COTA/L 07/18/2014, 2:09 PM

## 2014-07-19 LAB — CBC
HEMATOCRIT: 27.8 % — AB (ref 36.0–46.0)
HEMOGLOBIN: 9.2 g/dL — AB (ref 12.0–15.0)
MCH: 31.1 pg (ref 26.0–34.0)
MCHC: 33.1 g/dL (ref 30.0–36.0)
MCV: 93.9 fL (ref 78.0–100.0)
Platelets: 248 10*3/uL (ref 150–400)
RBC: 2.96 MIL/uL — AB (ref 3.87–5.11)
RDW: 15 % (ref 11.5–15.5)
WBC: 9 10*3/uL (ref 4.0–10.5)

## 2014-07-19 LAB — GLUCOSE, CAPILLARY
GLUCOSE-CAPILLARY: 164 mg/dL — AB (ref 70–99)
GLUCOSE-CAPILLARY: 159 mg/dL — AB (ref 70–99)
GLUCOSE-CAPILLARY: 140 mg/dL — AB (ref 70–99)

## 2014-07-19 MED ORDER — ASPIRIN 325 MG PO TBEC: 325.0000 mg | DELAYED_RELEASE_TABLET | Freq: Two times a day (BID) | ORAL | Status: DC

## 2014-07-19 MED ORDER — OXYCODONE-ACETAMINOPHEN 5-325 MG PO TABS: 1.0000 | ORAL_TABLET | Freq: Four times a day (QID) | ORAL | Status: DC | PRN

## 2014-07-19 MED ORDER — METHOCARBAMOL 500 MG PO TABS: 500.0000 mg | ORAL_TABLET | Freq: Four times a day (QID) | ORAL | Status: DC | PRN

## 2014-07-19 NOTE — Progress Notes (Signed)
Agree with PTA.    Exie Chrismer, PT [phone]  

## 2014-07-19 NOTE — Progress Notes (Signed)
Subjective: 3 Days Post-Op Procedure(s) (LRB): TOTAL HIP ARTHROPLASTY ANTERIOR APPROACH (Right)  Activity level:  wbat Diet tolerance:  Eating well Voiding:  ok Patient reports pain as mild.    Objective: Vital signs in last 24 hours: Temp:  [98.5 F (36.9 C)-99.1 F (37.3 C)] 98.5 F (36.9 C) (09/18 0539) Pulse Rate:  [99-114] 100 (09/18 0539) Resp:  [16-18] 18 (09/18 0539) BP: (88-126)/(39-66) 126/39 mmHg (09/18 0539) SpO2:  [93 %-95 %] 94 % (09/18 0539)  Labs:  Recent Labs  07/17/14 0531 07/18/14 0502 07/19/14 0549  HGB 10.8* 9.8* 9.2*    Recent Labs  07/18/14 0502 07/19/14 0549  WBC 10.8* 9.0  RBC 3.21* 2.96*  HCT 30.1* 27.8*  PLT 226 248    Recent Labs  07/17/14 0531 07/18/14 0502  NA 134* 134*  K 5.3 4.0  CL 95* 94*  CO2 30 27  BUN 9 8  CREATININE 0.64 0.65  GLUCOSE 189* 173*  CALCIUM 9.6 9.5   No results found for this basename: LABPT, INR,  in the last 72 hours  Physical Exam:  Neurologically intact ABD soft Neurovascular intact Sensation intact distally Intact pulses distally Dorsiflexion/Plantar flexion intact Incision: dressing C/D/I and scant drainage No cellulitis present Compartment soft  Assessment/Plan:  3 Days Post-Op Procedure(s) (LRB): TOTAL HIP ARTHROPLASTY ANTERIOR APPROACH (Right) Advance diet Up with therapy Discharge to SNF Today Continue on asa 325mg  BID x 4 weeks post op. Follow up in office 2 weeks post op.    Ashley Sims, Larwance Sachs 07/19/2014, 7:29 AM

## 2014-07-19 NOTE — Progress Notes (Signed)
SW called getting update with discharge, still awaiting for insurance approval.Patient made aware.

## 2014-07-19 NOTE — Clinical Social Work Placement (Signed)
Clinical Social Work Department CLINICAL SOCIAL WORK PLACEMENT NOTE 07/19/2014  Patient:  Ashley Sims, Ashley Sims  Account Number:  [id number] Admit date:  07/16/2014  Clinical Social Worker:  Wylene Men  Date/time:  07/18/2014 11:00 AM  Clinical Social Work is seeking post-discharge placement for this patient at the following level of care:   [place]   (*CSW will update this form in Epic as items are completed)   07/18/2014  Patient/family provided with [facility] Department of Clinical Social Work's list of facilities offering this level of care within the geographic area requested by the patient (or if unable, by the patient's family).  07/18/2014  Patient/family informed of their freedom to choose among providers that offer the needed level of care, that participate in Medicare, Medicaid or managed care program needed by the patient, have an available bed and are willing to accept the patient.  07/18/2014  Patient/family informed of [facility]' ownership interest in [facility], as well as of the fact that they are under no obligation to receive care at this facility.  PASARR submitted to EDS on 07/18/2014 PASARR number received on 07/18/2014  FL2 transmitted to all facilities in geographic area requested by pt/family on  07/18/2014 FL2 transmitted to all facilities within larger geographic area on 07/18/2014  Patient informed that his/her managed care company has contracts with or will negotiate with  certain facilities, including the following:     Patient/family informed of bed offers received:   Patient chooses bed at  Physician recommends and patient chooses bed at    Patient to be transferred to  on   Patient to be transferred to facility by  Patient and family notified of transfer on  Name of family member notified:    The following physician request were entered in Epic:   Additional Comments:  [name], [place] [phone]  Psychiatric &  Orthopedics (5N 1-16) Clinical Social Worker

## 2014-07-19 NOTE — Clinical Social Work Psychosocial (Signed)
Clinical Social Work Department BRIEF PSYCHOSOCIAL ASSESSMENT 07/19/2014  Patient:  Ashley Sims     Account Number:  [id number]     Admit date:  07/16/2014  Clinical Social Worker:  Wylene Men  Date/Time:  07/19/2014 12:25 PM  Referred by:  Physician  Date Referred:  07/19/2014 Referred for  SNF Placement  Psychosocial assessment   Other Referral:   none   Interview type:  Other - See comment Other interview type:   Ashley Sims- neice    PSYCHOSOCIAL DATA Living Status:  FACILITY Admitted from facility:  [place] Level of care:  [place] Primary support name:  Ashley Sims Primary support relationship to patient:  FAMILY Degree of support available:   adequate    CURRENT CONCERNS Current Concerns  Post-Acute Placement   Other Concerns:   none    SOCIAL WORK ASSESSMENT / PLAN CSW assessed pt.  Pt is alert and only oriented to person. CSW contacted neice, Ashley Sims who reports that pt is from [facility] and it is her intention for pt to return once medically discharged from the hospital.  [place] spoke with Ashley Sims at [facility] who reports that pt may return.    Ashley Sims is very supportive and is main caregiver for pt.  Ashley Sims expresses hopefulness regarding pt's prognosis and progression so far.    CSW will provide [facility] with appropriate clinical information.   Assessment/plan status:  Psychosocial Support/Ongoing Assessment of Needs Other assessment/ plan:   FL2  PASARR- should be existing   Information/referral to community resources:   SNF    PATIENT'S/FAMILY'S RESPONSE TO PLAN OF CARE: Caregiver, Ashley Sims (neice) was agreeable for pt to return back to [facility] SNF once medically stable.  Niece was appreciative of CSW assistance and support.       Ashley Sims, [place] [phone]  Psychiatric & Orthopedics (5N 1-16) Clinical Social Worker

## 2014-07-19 NOTE — Progress Notes (Signed)
CARE MANAGEMENT NOTE 07/19/2014  Patient:  Ashley Sims, Ashley Sims   Account Number:  [id number]  Date Initiated:  07/17/2014  Documentation initiated by:  [facility]  Subjective/Objective Assessment:   admitted with s/p rt THA     Action/Plan:   Pt/Ot evals-recommended SNF   Anticipated DC Date:  07/18/2014   Anticipated DC Plan:  SKILLED NURSING FACILITY  In-house referral  Clinical Social Worker      DC Planning Services  CM consult      [facility] Choice  DURABLE MEDICAL EQUIPMENT   Choice offered to / List presented to:  C-1 Patient   DME arranged  3-N-1  Vassie Moselle      DME agency  [employer]        Status of service:  Completed, signed off Medicare Important Message given?   (If response is "NO", the following Medicare IM given date fields will be blank) Date Medicare IM given:   Medicare IM given by:   Date Additional Medicare IM given:   Additional Medicare IM given by:    Discharge Disposition:  [place]  Per UR Regulation:    If discussed at Long Length of Stay Meetings, dates discussed:    Comments:  07/19/14 Discharge plan changed to SNF due to lack of progress with PT. Notified [facility] of change in d/c plan.  07/17/14 Set up by MD office with [facility] for HHPT. Received call from [facility] with [facility] requesting HHPT order and H and P be faxed to [phone]. Spoke with patient, no change in discharge plan. Faxed requested information. Rolling walker and 3N1 delivered to patient's room by [employer]. Patient will have family to assist after discharge. Fuller Plan RN, [facility]

## 2014-07-19 NOTE — Clinical Social Work Note (Addendum)
12:15pm- CSW spoke with pt husband, Denice [place].  Denice [place] stated that his first choice is [facility].  [facility] is not able to offer a bed at this time.  CSW presented other bed offers to the husband.  At this time, pt chooses [place], [place].  CSW contacted [facility] and they are in the process of obtaining insurance authorization.  Pt may not be discharged to SNF prior to insurance authorization. Please see the below note regarding the letter of guarantee/BCBS PPO insurance options.   11:54am- Pt has [employer] [place] PPO and currently has bed offers pending insurance authorization. CSW has also contacted the facilities that currently accept a letter of guarantee from [facility] to inquire if pt could possibly dc to SNF with letter of guarantee while awaiting insurance authorization. There have been no accepting facilities. Facilities are requesting insurance authorization prior to being dc from the hospital.  CSW continues to seek SNF placement for pt.   [name], [place] [phone]  Psychiatric & Orthopedics (5N 1-16) Clinical Social Worker

## 2014-07-19 NOTE — Discharge Summary (Signed)
Patient ID: Ashley Sims MRN: [id number] DOB/AGE: [birth year] 63 y.o.  Admit date: 07/16/2014 Discharge date: 07/19/2014  Admission Diagnoses:  Principal Problem:   Degenerative joint disease (DJD) of hip Active Problems:   Diabetes   Morbid obesity   Discharge Diagnoses:  Same  Past Medical History  Diagnosis Date  . Insomnia     takes Elavil nightly  . Hypertension     takes Lisinopril daily  . Diabetes mellitus without complication     takes Metformin daily  . Depression     takes Zoloft daily  . Hyperlipidemia     takes Simvastatin daily  . History of migraine     hasn't had one in yrs  . Cancer     uterine  . Arthritis     RA and osteo;takes Plaquenil daily and takes Methotrexate daily  . Chronic back pain     [facility];takes Oxycontin and PErcocet  . Joint pain   . Joint swelling   . GERD (gastroesophageal reflux disease)     takes Protonix daily  . History of colon polyps   . Stress incontinence   . Peripheral edema     left foot  . UTI (lower urinary tract infection)     was treated with Cipro 3wks ago    Surgeries: Procedure(s): TOTAL HIP ARTHROPLASTY ANTERIOR APPROACH on 07/16/2014   Consultants:    Discharged Condition: Improved  Hospital Course: Ashley Sims is an 63 y.o. female who was admitted 07/16/2014 for operative treatment ofDegenerative joint disease (DJD) of hip. Patient has severe unremitting pain that affects sleep, daily activities, and work/hobbies. After pre-op clearance the patient was taken to the operating room on 07/16/2014 and underwent  Procedure(s): TOTAL HIP ARTHROPLASTY ANTERIOR APPROACH.    Patient was given perioperative antibiotics: Anti-infectives   Start     Dose/Rate Route Frequency Ordered Stop   07/16/14 1430  hydroxychloroquine (PLAQUENIL) tablet 200 mg     200 mg Oral Daily 07/16/14 1323     07/16/14 1330  ceFAZolin (ANCEF) IVPB 2 g/50 mL premix     2 g 100 mL/hr over 30 Minutes  Intravenous Every 6 hours 07/16/14 1324 07/16/14 2226   07/16/14 0815  fluconazole (DIFLUCAN) IVPB 400 mg     400 mg 100 mL/hr over 120 Minutes Intravenous To Surgery 07/16/14 0810 07/16/14 0820   07/16/14 0600  ceFAZolin (ANCEF) IVPB 2 g/50 mL premix     2 g 100 mL/hr over 30 Minutes Intravenous On call to O.R. 07/15/14 1428 07/16/14 0738       Patient was given sequential compression devices, early ambulation, and chemoprophylaxis to prevent DVT.  Patient benefited maximally from hospital stay and there were no complications.    Recent vital signs: Patient Vitals for the past 24 hrs:  BP Temp Temp src Pulse Resp SpO2  07/19/14 0539 126/39 mmHg 98.5 F (36.9 C) Oral 100 18 94 %  07/19/14 0400 - - - - 16 -  07/19/14 0000 - - - - 16 -  07/18/14 2000 - - - - 16 -  07/18/14 1955 109/44 mmHg 99.1 F (37.3 C) Oral 99 18 95 %  07/18/14 1300 88/66 mmHg 98.5 F (36.9 C) - 114 18 93 %     Recent laboratory studies:  Recent Labs  07/17/14 0531 07/18/14 0502 07/19/14 0549  WBC 10.3 10.8* 9.0  HGB 10.8* 9.8* 9.2*  HCT 33.4* 30.1* 27.8*  PLT 259 226 248  NA 134* 134*  --  K 5.3 4.0  --   CL 95* 94*  --   CO2 30 27  --   BUN 9 8  --   CREATININE 0.64 0.65  --   GLUCOSE 189* 173*  --   CALCIUM 9.6 9.5  --      Discharge Medications:     Medication List    STOP taking these medications       meloxicam 15 MG tablet  Commonly known as:  MOBIC      TAKE these medications       amitriptyline 100 MG tablet  Commonly known as:  ELAVIL  Take 100 mg by mouth at bedtime.     aspirin 325 MG EC tablet  Take 1 tablet (325 mg total) by mouth 2 (two) times daily after a meal.     calcium carbonate 600 MG Tabs tablet  Commonly known as:  OS-CAL  Take 600 mg by mouth 2 (two) times daily with a meal.     cholecalciferol 1000 UNITS tablet  Commonly known as:  VITAMIN D  Take 1,000 Units by mouth daily.     folic acid 1 MG tablet  Commonly known as:  FOLVITE  Take 1 mg by  mouth daily.     hydroxychloroquine 200 MG tablet  Commonly known as:  PLAQUENIL  Take 200 mg by mouth daily.     lisinopril 10 MG tablet  Commonly known as:  PRINIVIL,ZESTRIL  Take 10 mg by mouth daily.     metFORMIN 500 MG tablet  Commonly known as:  GLUCOPHAGE  Take 500 mg by mouth 2 (two) times daily with a meal.     methocarbamol 500 MG tablet  Commonly known as:  ROBAXIN  Take 1 tablet (500 mg total) by mouth every 6 (six) hours as needed for muscle spasms.     methotrexate 2.5 MG tablet  Commonly known as:  RHEUMATREX  Take 12.5 mg by mouth 2 (two) times a week. Caution:Chemotherapy. Protect from light.     oxyCODONE-acetaminophen 5-325 MG per tablet  Commonly known as:  PERCOCET/ROXICET  Take 1-2 tablets by mouth every 6 (six) hours as needed for severe pain.     OXYCONTIN 10 mg T12a 12 hr tablet  Generic drug:  OxyCODONE  Take 10 mg by mouth every 12 (twelve) hours.     sertraline 50 MG tablet  Commonly known as:  ZOLOFT  Take 50 mg by mouth daily.     simvastatin 40 MG tablet  Commonly known as:  ZOCOR  Take 40 mg by mouth daily.        Diagnostic Studies: Dg Chest 2 View  07/05/2014   CLINICAL DATA:  Preop for right hip surgery, diabetes, former smoking history  EXAM: CHEST  2 VIEW  COMPARISON:  None.  FINDINGS: No active infiltrate or effusion is seen. Mediastinal and hilar contours are unremarkable. The heart is within normal limits in size. A right-sided Port-A-Cath is present with the tip overlying the mid lower SVC. No acute bony abnormality is noted.  IMPRESSION: No active lung disease.  Port-A-Cath tip overlies the mid lower SVC.   Electronically Signed   By: Ivar Drape M.D.   On: 07/05/2014 11:45   Dg Hip Operative Right  07/16/2014   CLINICAL DATA:  Right anterior hip replacement  EXAM: DG OPERATIVE RIGHT HIP  TECHNIQUE: A single spot fluoroscopic AP image of the right hip is submitted.  COMPARISON:  10/17/2013  FINDINGS: Total hip arthroplasty  identified with femoral and acetabular components in anticipated position.  IMPRESSION: Anticipated postoperative appearance   Electronically Signed   By: Skipper Cliche M.D.   On: 07/16/2014 10:53    Disposition: Final discharge disposition not confirmed      Discharge Instructions   Call MD / Call 911    Complete by:  As directed   If you experience chest pain or shortness of breath, CALL 911 and be transported to the hospital emergency room.  If you develope a fever above 101 F, pus (white drainage) or increased drainage or redness at the wound, or calf pain, call your surgeon's office.     Constipation Prevention    Complete by:  As directed   Drink plenty of fluids.  Prune juice may be helpful.  You may use a stool softener, such as Colace (over the counter) 100 mg twice a day.  Use MiraLax (over the counter) for constipation as needed.     Diet - low sodium heart healthy    Complete by:  As directed      Increase activity slowly as tolerated    Complete by:  As directed            Follow-up Information   Follow up with Hessie Dibble, MD. Call in 2 weeks.   Specialty:  Orthopedic Surgery   Contact information:   [place] [address] [phone]       Follow up with [place].   Specialty:  Home Health Services   Contact information:   [address] [place] [address] [phone]        Signed: Rich Fuchs 07/19/2014, 7:33 AM

## 2014-07-19 NOTE — Clinical Social Work Psychosocial (Signed)
Clinical Social Work Department BRIEF PSYCHOSOCIAL ASSESSMENT 07/19/2014  Patient:  Ashley Sims, Ashley Sims     Account Number:  [id number]     Admit date:  07/16/2014  Clinical Social Worker:  Wylene Men  Date/Time:  07/18/2014 11:00 AM  Referred by:  Physician  Date Referred:  07/18/2014 Referred for  SNF Placement  Psychosocial assessment   Other Referral:   none   Interview type:  Other - See comment Other interview type:   patient and husband    PSYCHOSOCIAL DATA Living Status:  HUSBAND Admitted from facility:   Level of care:   Primary support name:  Ashley Sims Primary support relationship to patient:  SPOUSE Degree of support available:   adequate    CURRENT CONCERNS Current Concerns  Post-Acute Placement   Other Concerns:   none    SOCIAL WORK ASSESSMENT / PLAN CSW assessed pt at bedside. Pt husband, Ashley Sims was present at time of assessment per pt request.  Pt was alert and oriented x4 during the course of the assessment. Pt husband, Ashley Sims participated and exhibited support and assistance to pt.  Per PT/OT report, pt is slow to progress.  Pt states she was not very ambulatory prior to the surgery and is looking forward to possibly being able to move more feely/less painfully.    CSW introduced self and CSW role.  PT is recommending SNF/STR upon being medically stable and discharged.  Pt and husband are both agreeable to this recommendation.  Neither are familiar with any facility, but request a search be started in [facility], more specifically pt's choice is facility in [facility].  CSW will begin SNF process.   Assessment/plan status:  Psychosocial Support/Ongoing Assessment of Needs Other assessment/ plan:   FL2  PASARR   Information/referral to community resources:   SNF    PATIENT'S/FAMILY'S RESPONSE TO PLAN OF CARE: Pt and husband are agreeable to plan of care.  Pt and husband were appreciative of CSW assistance and support.       [name], [place] [phone]  Psychiatric & Orthopedics (5N 1-16) Clinical Social Worker

## 2014-07-19 NOTE — Progress Notes (Signed)
Physical Therapy Treatment Patient Details Name: Ashley Sims MRN: [id number] DOB: [birth year] Today's Date: 07/19/2014    History of Present Illness Patient is a 63 y/o female s/p R THA. PMH of HTN, DM, depression, HLD, cancer and UTI. WBAT RLE. Anterior approach with no restrictions.    PT Comments    Patient progressing with ambulation efficiency today and appears to be more motivated. I think she feels relieved that she has now planned to go to SNF for more rehab prior to returning home. Continue to recommend SNF for ongoing Physical Therapy.     Follow Up Recommendations  Supervision/Assistance - 24 hour;SNF     Equipment Recommendations  Rolling walker with 5" wheels    Recommendations for Other Services       Precautions / Restrictions Precautions Precautions: Anterior Hip;Fall Precaution Comments: No restrictions Restrictions Weight Bearing Restrictions: No RLE Weight Bearing: Weight bearing as tolerated    Mobility  Bed Mobility               General bed mobility comments: in recliner upon arrival.  Transfers Overall transfer level: Needs assistance Equipment used: Rolling walker (2 wheeled)   Sit to Stand: Min guard         General transfer comment: Cues for hand placement, requires increased time for prep  Ambulation/Gait Ambulation/Gait assistance: Min guard Ambulation Distance (Feet): 40 Feet Assistive device: Rolling walker (2 wheeled) Gait Pattern/deviations: Step-to pattern;Decreased step length - right;Decreased stance time - left Gait velocity: decreased   General Gait Details: Patient able to advance R LE more today with gait. Did take one standing rest break but much more efficient this session.    Stairs            Wheelchair Mobility    Modified Rankin (Stroke Patients Only)       Balance                                    Cognition Arousal/Alertness: Awake/alert Behavior During Therapy: WFL for  tasks assessed/performed Overall Cognitive Status: Within Functional Limits for tasks assessed                      Exercises Total Joint Exercises Quad Sets: Both;10 reps;Seated Gluteal Sets: Both;10 reps;Supine Heel Slides: AAROM;10 reps;Right Hip ABduction/ADduction: AAROM;Right;10 reps Long Arc Quad: AAROM;Right;10 reps    General Comments        Pertinent Vitals/Pain Pain Assessment: No/denies pain Pain Score: 5  Pain Location:  R hip Pain Descriptors / Indicators: Aching;Sore Pain Intervention(s): Monitored during session;Limited activity within patient's tolerance;RN gave pain meds during session    Home Living                      Prior Function            PT Goals (current goals can now be found in the care plan section) Progress towards PT goals: Progressing toward goals    Frequency  7X/week    PT Plan Current plan remains appropriate    Co-evaluation             End of Session Equipment Utilized During Treatment: Gait belt Activity Tolerance: Patient tolerated treatment well Patient left: in chair;with call bell/phone within reach     Time: 0930-0956 PT Time Calculation (min): 26 min  Charges:  $Gait Training: 8-22 mins $Therapeutic Exercise: 8-22 mins  G Codes:      Jacqualyn Posey 07/19/2014, 10:59 AM 07/19/2014 Jacqualyn Posey PTA [phone] pager [phone] office

## 2014-07-19 NOTE — Progress Notes (Signed)
Report called to [facility]. 

## 2014-07-22 ENCOUNTER — Non-Acute Institutional Stay (SKILLED_NURSING_FACILITY): Payer: BC Managed Care – PPO | Admitting: Adult Health

## 2014-07-22 ENCOUNTER — Encounter: Payer: Self-pay | Admitting: Adult Health

## 2014-07-22 DIAGNOSIS — F3289 Other specified depressive episodes: Secondary | ICD-10-CM

## 2014-07-22 DIAGNOSIS — E785 Hyperlipidemia, unspecified: Secondary | ICD-10-CM | POA: Insufficient documentation

## 2014-07-22 DIAGNOSIS — M069 Rheumatoid arthritis, unspecified: Secondary | ICD-10-CM

## 2014-07-22 DIAGNOSIS — E1165 Type 2 diabetes mellitus with hyperglycemia: Secondary | ICD-10-CM

## 2014-07-22 DIAGNOSIS — G4700 Insomnia, unspecified: Secondary | ICD-10-CM

## 2014-07-22 DIAGNOSIS — G8929 Other chronic pain: Secondary | ICD-10-CM

## 2014-07-22 DIAGNOSIS — M1611 Unilateral primary osteoarthritis, right hip: Secondary | ICD-10-CM

## 2014-07-22 DIAGNOSIS — F32A Depression, unspecified: Secondary | ICD-10-CM | POA: Insufficient documentation

## 2014-07-22 DIAGNOSIS — M1610 Unilateral primary osteoarthritis, unspecified hip: Secondary | ICD-10-CM

## 2014-07-22 DIAGNOSIS — D62 Acute posthemorrhagic anemia: Secondary | ICD-10-CM | POA: Insufficient documentation

## 2014-07-22 DIAGNOSIS — M549 Dorsalgia, unspecified: Secondary | ICD-10-CM

## 2014-07-22 DIAGNOSIS — F329 Major depressive disorder, single episode, unspecified: Secondary | ICD-10-CM

## 2014-07-22 DIAGNOSIS — I10 Essential (primary) hypertension: Secondary | ICD-10-CM | POA: Insufficient documentation

## 2014-07-22 DIAGNOSIS — IMO0001 Reserved for inherently not codable concepts without codable children: Secondary | ICD-10-CM

## 2014-07-23 ENCOUNTER — Non-Acute Institutional Stay (SKILLED_NURSING_FACILITY): Payer: BC Managed Care – PPO | Admitting: Internal Medicine

## 2014-07-23 DIAGNOSIS — E119 Type 2 diabetes mellitus without complications: Secondary | ICD-10-CM

## 2014-07-23 DIAGNOSIS — M1611 Unilateral primary osteoarthritis, right hip: Secondary | ICD-10-CM

## 2014-07-23 DIAGNOSIS — G4700 Insomnia, unspecified: Secondary | ICD-10-CM

## 2014-07-23 DIAGNOSIS — M1610 Unilateral primary osteoarthritis, unspecified hip: Secondary | ICD-10-CM

## 2014-07-23 DIAGNOSIS — D62 Acute posthemorrhagic anemia: Secondary | ICD-10-CM

## 2014-07-24 NOTE — Progress Notes (Signed)
Patient ID: Ashley Sims, female   DOB: 01-01-1951, 63 y.o.   MRN: [id number]               PROGRESS NOTE  DATE: 07/22/2014  FACILITY: Nursing Home Location: [facility]  LEVEL OF CARE: SNF (31)  Acute Visit  CHIEF COMPLAINT:  Follow-up Hospitalization  HISTORY OF PRESENT ILLNESS:  This is a 63 year old female who has been admitted to [facility] on 07/19/14 from [facility] with DJD S/P Right hip arthroplasty. She has been admitted for a short-term rehabilitation.  REASSESSMENT OF ONGOING PROBLEM(S):  HTN: Pt 's HTN remains stable.  Denies CP, sob, DOE, headaches, dizziness or visual disturbances.  No complications from the medications currently being used.  Last BP : 129/56  DEPRESSION: The depression remains stable. Patient denies ongoing feelings of sadness, insomnia, anedhonia or lack of appetite. No complications reported from the medications currently being used. Staff do not report behavioral problems.  ANEMIA: The anemia has been stable. The patient denies fatigue, melena or hematochezia. No complications from the medications currently being used. 9/15 hgb 9.2    PAST MEDICAL HISTORY : Reviewed.  No changes/see problem list  CURRENT MEDICATIONS: Reviewed per MAR/see medication list  REVIEW OF SYSTEMS:  GENERAL: no change in appetite, no fatigue, no weight changes, no fever, chills or weakness RESPIRATORY: no cough, SOB, DOE, wheezing, hemoptysis CARDIAC: no chest pain, or palpitations, +edema GI: no abdominal pain, diarrhea, constipation, heart burn, nausea or vomiting  PHYSICAL EXAMINATION  GENERAL: no acute distress, obese EYES: conjunctivae normal, sclerae normal, normal eye lids NECK: supple, trachea midline, no neck masses, no thyroid tenderness, no thyromegaly LYMPHATICS: no LAN in the neck, no supraclavicular LAN RESPIRATORY: breathing is even & unlabored, BS CTAB CARDIAC: RRR, no murmur,no extra heart sounds, RLE edema 1+ GI:  abdomen soft, normal BS, no masses, no tenderness, no hepatomegaly, no splenomegaly EXTREMITIES:  Able to move all 4 extremities PSYCHIATRIC: the patient is alert & oriented to person, affect & behavior appropriate  LABS/RADIOLOGY: Labs reviewed: Basic Metabolic Panel:  Recent Labs  07/05/14 1140 07/17/14 0531 07/18/14 0502  NA 141 134* 134*  K 4.2 5.3 4.0  CL 103 95* 94*  CO2 25 30 27   GLUCOSE 146* 189* 173*  BUN 15 9 8   CREATININE 0.75 0.64 0.65  CALCIUM 9.8 9.6 9.5   CBC:  Recent Labs  07/05/14 1140 07/17/14 0531 07/18/14 0502 07/19/14 0549  WBC 6.0 10.3 10.8* 9.0  NEUTROABS 4.9  --   --   --   HGB 13.3 10.8* 9.8* 9.2*  HCT 40.7 33.4* 30.1* 27.8*  MCV 94.2 94.6 93.8 93.9  PLT 301 259 226 248    Recent Labs  07/19/14 0635 07/19/14 1113 07/19/14 1559  GLUCAP 164* 159* 140*    EXAM: CHEST  2 VIEW   COMPARISON:  None.   FINDINGS: No active infiltrate or effusion is seen. Mediastinal and hilar contours are unremarkable. The heart is within normal limits in size. A right-sided Port-A-Cath is present with the tip overlying the mid lower SVC. No acute bony abnormality is noted.   IMPRESSION: No active lung disease.  Port-A-Cath tip overlies the mid lower SVC. EXAM: DG OPERATIVE RIGHT HIP   TECHNIQUE: A single spot fluoroscopic AP image of the right hip is submitted.   COMPARISON:  10/17/2013   FINDINGS: Total hip arthroplasty identified with femoral and acetabular components in anticipated position.   IMPRESSION: Anticipated postoperative appearance   ASSESSMENT/PLAN:  DJD status post right hip arthroplasty - for rehabilitation  Hypertension - well controlled; continue lisinopril Depression - continue Zoloft Hyperlipidemia - continue Zocor Insomnia - continue Elavil Rheumatoid arthritis - able; continue Plaquenil and methotrexate Chronic back pain - continue OxyContin Diabetes mellitus, type II - well controlled; continue  Glucophage Anemia, acute blood loss - check CBC   CPT CODE: 48016  Seth Bake - NP [facility] [phone]

## 2014-07-25 DIAGNOSIS — IMO0002 Reserved for concepts with insufficient information to code with codable children: Secondary | ICD-10-CM | POA: Insufficient documentation

## 2014-07-25 DIAGNOSIS — E1140 Type 2 diabetes mellitus with diabetic neuropathy, unspecified: Secondary | ICD-10-CM | POA: Insufficient documentation

## 2014-07-25 DIAGNOSIS — E1165 Type 2 diabetes mellitus with hyperglycemia: Secondary | ICD-10-CM

## 2014-07-25 NOTE — Progress Notes (Signed)
HISTORY & PHYSICAL  DATE: 07/23/2014   FACILITY: [facility]  LEVEL OF CARE: SNF (31)  ALLERGIES:  Allergies  Allergen Reactions  . Bee Venom   . Tramadol     CHIEF COMPLAINT:  Manage right hip osteoarthritis, diabetes mellitus and insomnia  HISTORY OF PRESENT ILLNESS: Patient is a 63 year old Caucasian female.  HIP OSTEOARTHRITIS: patient had advanced end stage OA of the hip with progressively worsening pain & dysfunction.  Pt failed non-surgical conservative management.  Therefore pt underwent total hip arthroplasty & tolerated the procedure well.  Pt denies hip pain currently.  Pt was admitted to this facility for short term rehabilitation. Complains of right lower extremity swelling.  DM:pt's DM remains stable.  Pt denies polyuria, polydipsia, polyphagia, changes in vision or hypoglycemic episodes.  No complications noted from the medication presently being used.  Last hemoglobin A1c is: Not available.  INSOMNIA: The insomnia remains stable.  No complications noted from the medications presently being used. Patient denies ongoing insomnia, pain, hallucinations, delusions.  PAST MEDICAL HISTORY :  Past Medical History  Diagnosis Date  . Insomnia     takes Elavil nightly  . Hypertension     takes Lisinopril daily  . Diabetes mellitus without complication     takes Metformin daily  . Depression     takes Zoloft daily  . Hyperlipidemia     takes Simvastatin daily  . History of migraine     hasn't had one in yrs  . Cancer     uterine  . Arthritis     RA and osteo;takes Plaquenil daily and takes Methotrexate daily  . Chronic back pain     go[facility] to [facility];takes Oxycontin and PErcocet  . Joint pain   . Joint swelling   . GERD (gastroesophageal reflux disease)     takes Protonix daily  . History of colon polyps   . Stress incontinence   . Peripheral edema     left foot  . UTI (lower urinary tract infection)     was  treated with Cipro 3wks ago    PAST SURGICAL HISTORY: Past Surgical History  Procedure Laterality Date  . Abdominal hysterectomy    . Cesarean section    . Breast biopsy    . Colonoscopy    . Total hip arthroplasty Right 07/16/2014    DR DALLDORF  . Total hip arthroplasty Right 07/16/2014    Procedure: TOTAL HIP ARTHROPLASTY ANTERIOR APPROACH;  Surgeon: Hessie Dibble, MD;  Location: [facility];  Service: Orthopedics;  Laterality: Right;    SOCIAL HISTORY:  reports that she has quit smoking. She has never used smokeless tobacco. She reports that she does not drink alcohol or use illicit drugs.  FAMILY HISTORY: None  CURRENT MEDICATIONS: Reviewed per MAR/see medication list  REVIEW OF SYSTEMS:  See HPI otherwise 14 point ROS is negative.  PHYSICAL EXAMINATION  VS:  See VS section  GENERAL: no acute distress, moderately obese body habitus EYES: conjunctivae normal, sclerae normal, normal eye lids MOUTH/THROAT: lips without lesions,no lesions in the mouth,tongue is without lesions,uvula elevates in midline NECK: supple, trachea midline, no neck masses, no thyroid tenderness, no thyromegaly LYMPHATICS: no LAN in the neck, no supraclavicular LAN RESPIRATORY: breathing is even & unlabored, BS CTAB CARDIAC: RRR, no murmur,no extra heart sounds, +2 right lower extremity and +1 left lower extremity edema GI:  ABDOMEN: abdomen soft, normal BS, no masses, no tenderness  LIVER/SPLEEN: no hepatomegaly, no splenomegaly MUSCULOSKELETAL: HEAD: normal to inspection  EXTREMITIES: LEFT UPPER EXTREMITY: full range of motion, normal strength & tone RIGHT UPPER EXTREMITY:  full range of motion, normal strength & tone LEFT LOWER EXTREMITY:  Minimal range of motion, normal strength & tone RIGHT LOWER EXTREMITY:  range of motion not tested due to surgery, normal strength & tone PSYCHIATRIC: the patient is alert & oriented to person, affect & behavior appropriate  LABS/RADIOLOGY:  Labs  reviewed: Basic Metabolic Panel:  Recent Labs  07/05/14 1140 07/17/14 0531 07/18/14 0502  NA 141 134* 134*  K 4.2 5.3 4.0  CL 103 95* 94*  CO2 25 30 27   GLUCOSE 146* 189* 173*  BUN 15 9 8   CREATININE 0.75 0.64 0.65  CALCIUM 9.8 9.6 9.5   CBC:  Recent Labs  07/05/14 1140 07/17/14 0531 07/18/14 0502 07/19/14 0549  WBC 6.0 10.3 10.8* 9.0  NEUTROABS 4.9  --   --   --   HGB 13.3 10.8* 9.8* 9.2*  HCT 40.7 33.4* 30.1* 27.8*  MCV 94.2 94.6 93.8 93.9  PLT 301 259 226 248   CBG:  Recent Labs  07/19/14 0635 07/19/14 1113 07/19/14 1559  GLUCAP 164* 159* 140*    CHEST  2 VIEW   COMPARISON:  None.   FINDINGS: No active infiltrate or effusion is seen. Mediastinal and hilar contours are unremarkable. The heart is within normal limits in size. A right-sided Port-A-Cath is present with the tip overlying the mid lower SVC. No acute bony abnormality is noted.   IMPRESSION: No active lung disease.  Port-A-Cath tip overlies the mid lower SVC.   DG OPERATIVE RIGHT HIP   TECHNIQUE: A single spot fluoroscopic AP image of the right hip is submitted.   COMPARISON:  10/17/2013   FINDINGS: Total hip arthroplasty identified with femoral and acetabular components in anticipated position.   IMPRESSION: Anticipated postoperative appearance   ASSESSMENT/PLAN:  Right hip osteoarthritis-status post total hip arthroplasty. Continue rehabilitation. Diabetes mellitus-continue metformin Insomnia-continue elavil Acute blood loss anemia-check hemoglobin Hypertension-blood pressure borderline. Will monitor. Rheumatoid arthritis-continue medications Check CBC  I have reviewed patient's medical records received at admission/from hospitalization.  CPT CODE: 12197  Kedrick Mcnamee Y Embrie Mikkelsen, [place] [phone]

## 2014-07-26 ENCOUNTER — Non-Acute Institutional Stay (SKILLED_NURSING_FACILITY): Payer: BC Managed Care – PPO | Admitting: Adult Health

## 2014-07-26 ENCOUNTER — Encounter: Payer: Self-pay | Admitting: Adult Health

## 2014-07-26 DIAGNOSIS — E119 Type 2 diabetes mellitus without complications: Secondary | ICD-10-CM

## 2014-07-26 DIAGNOSIS — M1611 Unilateral primary osteoarthritis, right hip: Secondary | ICD-10-CM

## 2014-07-26 DIAGNOSIS — D62 Acute posthemorrhagic anemia: Secondary | ICD-10-CM

## 2014-07-26 DIAGNOSIS — F329 Major depressive disorder, single episode, unspecified: Secondary | ICD-10-CM

## 2014-07-26 DIAGNOSIS — M549 Dorsalgia, unspecified: Secondary | ICD-10-CM

## 2014-07-26 DIAGNOSIS — G4700 Insomnia, unspecified: Secondary | ICD-10-CM

## 2014-07-26 DIAGNOSIS — F3289 Other specified depressive episodes: Secondary | ICD-10-CM

## 2014-07-26 DIAGNOSIS — M1610 Unilateral primary osteoarthritis, unspecified hip: Secondary | ICD-10-CM

## 2014-07-26 DIAGNOSIS — I10 Essential (primary) hypertension: Secondary | ICD-10-CM

## 2014-07-26 DIAGNOSIS — E785 Hyperlipidemia, unspecified: Secondary | ICD-10-CM

## 2014-07-26 DIAGNOSIS — M069 Rheumatoid arthritis, unspecified: Secondary | ICD-10-CM

## 2014-07-26 DIAGNOSIS — G8929 Other chronic pain: Secondary | ICD-10-CM

## 2014-07-26 DIAGNOSIS — F32A Depression, unspecified: Secondary | ICD-10-CM

## 2014-07-26 NOTE — Progress Notes (Signed)
Patient ID: Ashley Sims, female   DOB: Dec 24, 1950, 63 y.o.   MRN: [id number]              PROGRESS NOTE  DATE:       07/26/14  FACILITY: Nursing Home Location: [facility]  LEVEL OF CARE: SNF (31)   Discharge Notes  HISTORY OF PRESENT ILLNESS:  This is a 63 year old female who is for discharge home with home health PT, OT and nursing. She has been admitted to [facility] on 07/19/14 from [facility] with DJD S/P Right hip arthroplasty. Patient was admitted to this facility for short-term rehabilitation after the patient's recent hospitalization.  Patient has completed SNF rehabilitation and therapy has cleared the patient for discharge.  REASSESSMENT OF ONGOING PROBLEM(S):  HTN: Pt 's HTN remains stable.  Denies CP, sob, DOE, headaches, dizziness or visual disturbances.  No complications from the medications currently being used.  Last BP : 114/58  HYPERLIPIDEMIA: No complications from the medications presently being used. Last fasting lipid panel showed : not available  INSOMNIA: The insomnia remains stable.  No complications noted from the medications presently being used. Patient denies ongoing insomnia, pain, hallucinations, delusions.  PAST MEDICAL HISTORY : Reviewed.  No changes/see problem list  CURRENT MEDICATIONS: Reviewed per MAR/see medication list  REVIEW OF SYSTEMS:  GENERAL: no change in appetite, no fatigue, no weight changes, no fever, chills or weakness RESPIRATORY: no cough, SOB, DOE, wheezing, hemoptysis CARDIAC: no chest pain, or palpitations, +edema GI: no abdominal pain, diarrhea, constipation, heart burn, nausea or vomiting  PHYSICAL EXAMINATION  GENERAL: no acute distress, obese NECK: supple, trachea midline, no neck masses, no thyroid tenderness, no thyromegaly LYMPHATICS: no LAN in the neck, no supraclavicular LAN RESPIRATORY: breathing is even & unlabored, BS CTAB CARDIAC: RRR, no murmur,no extra heart sounds, RLE edema  1+ GI: abdomen soft, normal BS, no masses, no tenderness, no hepatomegaly, no splenomegaly EXTREMITIES:  Able to move all 4 extremities PSYCHIATRIC: the patient is alert & oriented to person, affect & behavior appropriate  LABS/RADIOLOGY:  07/23/14   WB 71 8.7 hematocrit 28.6 Labs reviewed: Basic Metabolic Panel:  Recent Labs  07/05/14 1140 07/17/14 0531 07/18/14 0502  NA 141 134* 134*  K 4.2 5.3 4.0  CL 103 95* 94*  CO2 25 30 27   GLUCOSE 146* 189* 173*  BUN 15 9 8   CREATININE 0.75 0.64 0.65  CALCIUM 9.8 9.6 9.5   CBC:  Recent Labs  07/05/14 1140 07/17/14 0531 07/18/14 0502 07/19/14 0549  WBC 6.0 10.3 10.8* 9.0  NEUTROABS 4.9  --   --   --   HGB 13.3 10.8* 9.8* 9.2*  HCT 40.7 33.4* 30.1* 27.8*  MCV 94.2 94.6 93.8 93.9  PLT 301 259 226 248    Recent Labs  07/19/14 0635 07/19/14 1113 07/19/14 1559  GLUCAP 164* 159* 140*    EXAM: CHEST  2 VIEW   COMPARISON:  None.   FINDINGS: No active infiltrate or effusion is seen. Mediastinal and hilar contours are unremarkable. The heart is within normal limits in size. A right-sided Port-A-Cath is present with the tip overlying the mid lower SVC. No acute bony abnormality is noted.   IMPRESSION: No active lung disease.  Port-A-Cath tip overlies the mid lower SVC. EXAM: DG OPERATIVE RIGHT HIP   TECHNIQUE: A single spot fluoroscopic AP image of the right hip is submitted.   COMPARISON:  10/17/2013   FINDINGS: Total hip arthroplasty identified with femoral and acetabular  components in anticipated position.   IMPRESSION: Anticipated postoperative appearance   ASSESSMENT/PLAN:  DJD status post right hip arthroplasty - for home health PT, OT and nursing  Hypertension - well controlled; continue lisinopril Depression - continue Zoloft Hyperlipidemia - continue Zocor Insomnia - continue Elavil Rheumatoid arthritis - able; continue Plaquenil and methotrexate Chronic back pain - continue OxyContin Diabetes  mellitus, type II - well controlled; continue Glucophage Anemia, acute blood loss - stable   I have filled out patient's discharge paperwork and written prescriptions.  Patient will receive home health PT, OT and Nursing.  Total discharge time: Less than 30 minutes  Discharge time involved coordination of the discharge process with Education officer, museum, nursing staff and therapy department. Medical justification for home health services verified.   CPT CODE: 25053  Seth Bake - NP [facility] [phone]

## 2014-08-01 ENCOUNTER — Observation Stay (HOSPITAL_COMMUNITY)
Admission: EM | Admit: 2014-08-01 | Discharge: 2014-08-02 | Disposition: A | Discharge status: Home / Self Care | Payer: BC Managed Care – PPO | Attending: Family Medicine | Admitting: Family Medicine

## 2014-08-01 ENCOUNTER — Ambulatory Visit (HOSPITAL_COMMUNITY)
Admission: RE | Admit: 2014-08-01 | Discharge: 2014-08-01 | Disposition: A | Discharge status: Home / Self Care | Payer: BC Managed Care – PPO | Source: Ambulatory Visit | Attending: Urology | Admitting: Urology

## 2014-08-01 ENCOUNTER — Encounter (HOSPITAL_COMMUNITY): Payer: Self-pay | Admitting: Emergency Medicine

## 2014-08-01 ENCOUNTER — Other Ambulatory Visit (HOSPITAL_COMMUNITY): Payer: Self-pay | Admitting: Orthopedic Surgery

## 2014-08-01 DIAGNOSIS — N393 Stress incontinence (female) (male): Secondary | ICD-10-CM | POA: Diagnosis not present

## 2014-08-01 DIAGNOSIS — I82409 Acute embolism and thrombosis of unspecified deep veins of unspecified lower extremity: Secondary | ICD-10-CM

## 2014-08-01 DIAGNOSIS — I2510 Atherosclerotic heart disease of native coronary artery without angina pectoris: Secondary | ICD-10-CM | POA: Insufficient documentation

## 2014-08-01 DIAGNOSIS — M79609 Pain in unspecified limb: Secondary | ICD-10-CM

## 2014-08-01 DIAGNOSIS — Z8542 Personal history of malignant neoplasm of other parts of uterus: Secondary | ICD-10-CM | POA: Insufficient documentation

## 2014-08-01 DIAGNOSIS — M069 Rheumatoid arthritis, unspecified: Secondary | ICD-10-CM | POA: Insufficient documentation

## 2014-08-01 DIAGNOSIS — D62 Acute posthemorrhagic anemia: Secondary | ICD-10-CM | POA: Diagnosis not present

## 2014-08-01 DIAGNOSIS — Z96641 Presence of right artificial hip joint: Secondary | ICD-10-CM | POA: Diagnosis not present

## 2014-08-01 DIAGNOSIS — M1611 Unilateral primary osteoarthritis, right hip: Secondary | ICD-10-CM | POA: Insufficient documentation

## 2014-08-01 DIAGNOSIS — K219 Gastro-esophageal reflux disease without esophagitis: Secondary | ICD-10-CM | POA: Insufficient documentation

## 2014-08-01 DIAGNOSIS — M25561 Pain in right knee: Secondary | ICD-10-CM

## 2014-08-01 DIAGNOSIS — Z87891 Personal history of nicotine dependence: Secondary | ICD-10-CM | POA: Diagnosis not present

## 2014-08-01 DIAGNOSIS — Z7982 Long term (current) use of aspirin: Secondary | ICD-10-CM | POA: Insufficient documentation

## 2014-08-01 DIAGNOSIS — Z6839 Body mass index (BMI) 39.0-39.9, adult: Secondary | ICD-10-CM | POA: Diagnosis not present

## 2014-08-01 DIAGNOSIS — E119 Type 2 diabetes mellitus without complications: Secondary | ICD-10-CM | POA: Insufficient documentation

## 2014-08-01 DIAGNOSIS — I82401 Acute embolism and thrombosis of unspecified deep veins of right lower extremity: Secondary | ICD-10-CM

## 2014-08-01 DIAGNOSIS — I10 Essential (primary) hypertension: Secondary | ICD-10-CM | POA: Insufficient documentation

## 2014-08-01 DIAGNOSIS — I82441 Acute embolism and thrombosis of right tibial vein: Secondary | ICD-10-CM | POA: Diagnosis not present

## 2014-08-01 DIAGNOSIS — E785 Hyperlipidemia, unspecified: Secondary | ICD-10-CM | POA: Diagnosis not present

## 2014-08-01 DIAGNOSIS — M549 Dorsalgia, unspecified: Secondary | ICD-10-CM | POA: Insufficient documentation

## 2014-08-01 DIAGNOSIS — G8929 Other chronic pain: Secondary | ICD-10-CM | POA: Diagnosis not present

## 2014-08-01 DIAGNOSIS — F329 Major depressive disorder, single episode, unspecified: Secondary | ICD-10-CM | POA: Diagnosis not present

## 2014-08-01 DIAGNOSIS — M7989 Other specified soft tissue disorders: Secondary | ICD-10-CM

## 2014-08-01 DIAGNOSIS — G4700 Insomnia, unspecified: Secondary | ICD-10-CM | POA: Diagnosis not present

## 2014-08-01 DIAGNOSIS — Z79899 Other long term (current) drug therapy: Secondary | ICD-10-CM | POA: Insufficient documentation

## 2014-08-01 DIAGNOSIS — M79604 Pain in right leg: Secondary | ICD-10-CM | POA: Diagnosis present

## 2014-08-01 DIAGNOSIS — R609 Edema, unspecified: Secondary | ICD-10-CM

## 2014-08-01 HISTORY — DX: Acute embolism and thrombosis of unspecified deep veins of unspecified lower extremity: I82.409

## 2014-08-01 HISTORY — DX: Migraine, unspecified, not intractable, without status migrainosus: G43.909

## 2014-08-01 HISTORY — DX: Rheumatoid arthritis, unspecified: M06.9

## 2014-08-01 HISTORY — DX: Malignant neoplasm of uterus, part unspecified: C55

## 2014-08-01 HISTORY — DX: Type 2 diabetes mellitus without complications: E11.9

## 2014-08-01 HISTORY — DX: Unspecified osteoarthritis, unspecified site: M19.90

## 2014-08-01 LAB — I-STAT CHEM 8, ED
BUN: 14 mg/dL (ref 6–23)
CALCIUM ION: 1.19 mmol/L (ref 1.13–1.30)
Chloride: 99 mEq/L (ref 96–112)
Creatinine, Ser: 0.7 mg/dL (ref 0.50–1.10)
GLUCOSE: 125 mg/dL — AB (ref 70–99)
HEMATOCRIT: 29 % — AB (ref 36.0–46.0)
HEMOGLOBIN: 9.9 g/dL — AB (ref 12.0–15.0)
Potassium: 4 mEq/L (ref 3.7–5.3)
Sodium: 134 mEq/L — ABNORMAL LOW (ref 137–147)
TCO2: 23 mmol/L (ref 0–100)

## 2014-08-01 MED ORDER — OXYCODONE-ACETAMINOPHEN 5-325 MG PO TABS
1.0000 | ORAL_TABLET | ORAL | Status: DC | PRN
  Administered 2014-08-02 (×2): 2 via ORAL
  Filled 2014-08-01 (×2): qty 2

## 2014-08-01 MED ORDER — RIVAROXABAN 20 MG PO TABS
20.0000 mg | ORAL_TABLET | Freq: Every day | ORAL | Status: DC
Start: 2014-08-23 — End: 2014-08-02

## 2014-08-01 MED ORDER — ACETAMINOPHEN 650 MG RE SUPP: 650.0000 mg | Freq: Four times a day (QID) | RECTAL | Status: DC | PRN

## 2014-08-01 MED ORDER — CALCIUM CARBONATE 1250 (500 CA) MG PO TABS
1.0000 | ORAL_TABLET | Freq: Two times a day (BID) | ORAL | Status: DC
  Administered 2014-08-02: 500 mg via ORAL
  Filled 2014-08-01 (×3): qty 1

## 2014-08-01 MED ORDER — ONDANSETRON HCL 4 MG/2ML IJ SOLN
4.0000 mg | Freq: Once | INTRAMUSCULAR | Status: AC
  Administered 2014-08-01: 4 mg via INTRAVENOUS
  Filled 2014-08-01: qty 2

## 2014-08-01 MED ORDER — ASPIRIN EC 325 MG PO TBEC
325.0000 mg | DELAYED_RELEASE_TABLET | Freq: Two times a day (BID) | ORAL | Status: DC
  Filled 2014-08-01: qty 1

## 2014-08-01 MED ORDER — SENNA 8.6 MG PO TABS
2.0000 | ORAL_TABLET | Freq: Every day | ORAL | Status: DC
  Administered 2014-08-02: 17.2 mg via ORAL
  Filled 2014-08-01: qty 2

## 2014-08-01 MED ORDER — SIMVASTATIN 40 MG PO TABS
40.0000 mg | ORAL_TABLET | Freq: Every day | ORAL | Status: DC
  Administered 2014-08-02: 40 mg via ORAL
  Filled 2014-08-01: qty 1

## 2014-08-01 MED ORDER — SODIUM CHLORIDE 0.45 % IV SOLN
INTRAVENOUS | Status: DC
  Administered 2014-08-01: 23:00:00 via INTRAVENOUS

## 2014-08-01 MED ORDER — OXYCODONE HCL ER 10 MG PO T12A
10.0000 mg | EXTENDED_RELEASE_TABLET | Freq: Two times a day (BID) | ORAL | Status: DC
  Administered 2014-08-01 – 2014-08-02 (×2): 10 mg via ORAL
  Filled 2014-08-01 (×2): qty 1

## 2014-08-01 MED ORDER — METFORMIN HCL 500 MG PO TABS
500.0000 mg | ORAL_TABLET | Freq: Two times a day (BID) | ORAL | Status: DC
  Administered 2014-08-02: 500 mg via ORAL
  Filled 2014-08-01 (×3): qty 1

## 2014-08-01 MED ORDER — SODIUM CHLORIDE 0.9 % IJ SOLN
3.0000 mL | Freq: Two times a day (BID) | INTRAMUSCULAR | Status: DC
  Administered 2014-08-01: 3 mL via INTRAVENOUS

## 2014-08-01 MED ORDER — VITAMIN D3 25 MCG (1000 UNIT) PO TABS
1000.0000 [IU] | ORAL_TABLET | Freq: Every day | ORAL | Status: DC
  Administered 2014-08-02: 1000 [IU] via ORAL
  Filled 2014-08-01: qty 1

## 2014-08-01 MED ORDER — HYDROMORPHONE HCL 1 MG/ML IJ SOLN
0.5000 mg | INTRAMUSCULAR | Status: DC | PRN
  Administered 2014-08-01 – 2014-08-02 (×2): 0.5 mg via INTRAVENOUS
  Filled 2014-08-01 (×3): qty 1

## 2014-08-01 MED ORDER — LISINOPRIL 10 MG PO TABS: 10.0000 mg | ORAL_TABLET | Freq: Every day | ORAL | Status: DC

## 2014-08-01 MED ORDER — ASPIRIN EC 325 MG PO TBEC
325.0000 mg | DELAYED_RELEASE_TABLET | Freq: Every day | ORAL | Status: DC
  Administered 2014-08-02: 325 mg via ORAL
  Filled 2014-08-01: qty 1

## 2014-08-01 MED ORDER — FOLIC ACID 1 MG PO TABS
1.0000 mg | ORAL_TABLET | Freq: Every day | ORAL | Status: DC
  Administered 2014-08-02: 1 mg via ORAL
  Filled 2014-08-01: qty 1

## 2014-08-01 MED ORDER — METHOCARBAMOL 500 MG PO TABS
500.0000 mg | ORAL_TABLET | Freq: Four times a day (QID) | ORAL | Status: DC | PRN
  Administered 2014-08-02: 500 mg via ORAL
  Filled 2014-08-01 (×2): qty 1

## 2014-08-01 MED ORDER — CALCIUM CARBONATE 600 MG PO TABS: 600.0000 mg | ORAL_TABLET | Freq: Two times a day (BID) | ORAL | Status: DC

## 2014-08-01 MED ORDER — HYDROMORPHONE HCL 1 MG/ML IJ SOLN
1.0000 mg | Freq: Once | INTRAMUSCULAR | Status: AC
  Administered 2014-08-01: 1 mg via INTRAVENOUS
  Filled 2014-08-01: qty 1

## 2014-08-01 MED ORDER — INSULIN ASPART 100 UNIT/ML ~~LOC~~ SOLN: 0.0000 [IU] | Freq: Three times a day (TID) | SUBCUTANEOUS | Status: DC

## 2014-08-01 MED ORDER — MORPHINE SULFATE 4 MG/ML IJ SOLN
4.0000 mg | Freq: Once | INTRAMUSCULAR | Status: AC
  Administered 2014-08-01: 4 mg via INTRAVENOUS
  Filled 2014-08-01: qty 1

## 2014-08-01 MED ORDER — PANTOPRAZOLE SODIUM 40 MG PO TBEC
40.0000 mg | DELAYED_RELEASE_TABLET | Freq: Every day | ORAL | Status: DC
  Administered 2014-08-02: 40 mg via ORAL
  Filled 2014-08-01: qty 1

## 2014-08-01 MED ORDER — METHOTREXATE 2.5 MG PO TABS
12.5000 mg | ORAL_TABLET | ORAL | Status: DC
  Filled 2014-08-01: qty 5

## 2014-08-01 MED ORDER — SERTRALINE HCL 50 MG PO TABS
50.0000 mg | ORAL_TABLET | Freq: Every day | ORAL | Status: DC
  Filled 2014-08-01: qty 1

## 2014-08-01 MED ORDER — RIVAROXABAN 15 MG PO TABS
15.0000 mg | ORAL_TABLET | Freq: Two times a day (BID) | ORAL | Status: DC
  Administered 2014-08-01: 15 mg via ORAL
  Filled 2014-08-01 (×3): qty 1

## 2014-08-01 MED ORDER — HYDROXYCHLOROQUINE SULFATE 200 MG PO TABS
200.0000 mg | ORAL_TABLET | Freq: Two times a day (BID) | ORAL | Status: DC
  Administered 2014-08-01 – 2014-08-02 (×2): 200 mg via ORAL
  Filled 2014-08-01 (×3): qty 1

## 2014-08-01 MED ORDER — ACETAMINOPHEN 325 MG PO TABS: 650.0000 mg | ORAL_TABLET | Freq: Four times a day (QID) | ORAL | Status: DC | PRN

## 2014-08-01 NOTE — ED Notes (Signed)
Attempted report x1. 

## 2014-08-01 NOTE — H&P (Signed)
[facility] Admission History and Physical Service Pager: [phone]  Patient name: Ashley Sims Medical record number: [id number] Date of birth: 08/16/1951 Age: 63 y.o. Gender: female  Primary Care Provider: Tera Partridge Consultants: None Code Status: Full Code  Chief Complaint: Right leg pain  Assessment and Plan: Ashley Sims is a 62 y.o. female presenting with acute DVT. PMH is significant for right hip replacement, diabetes, hypertension and rheumatoid arthritis  #DVT: patient with recent hip surgery. No prior history of DVT. Not a candidate for thrombectomy of thrombolysis. Currently in significant pain. Received opiates from previous surgery. No signs or symptoms of PE. Currently without chest pain. - place in observation - continue Xarelto 15mg  BID x21 days with transition to Xarelto 20mg  for 6 months - continue home regimen of Oxycontin 10mg  BID and Percocet 1-2 tablets q4hrs - dilaudid 0.5mg  q6 hours for breakthrough pain - vitals per floor, with pulse ox - keep O2 >92% - EKG in AM  # Hypertension: soft blood pressures currently. Took lisinopril today. Asymptomatic  - hold lisinopril - monitor vitals   # Diabetes mellitus, type 2: currently on metformin only - continue metformin - SSI - A1C in AM  # CAD: patient on aspirin 325mg  BID - continue aspirin 325mg  daily - simvastatin 40mg  daily  # Anemia: remains stable from last hemoglobin on 9/18. Anemia occurred post-op. - ferrous sulfate 325mg  daily   # Rheumatoid arthritis - methotrexate 12.5mg  twice weekly on Saturday and Sunday - hydroxychloroquine 200mg  BID  # Depression - continue zoloft  # Viatmin D deficiency - continue vitamin D and calcium  FEN/GI: Heart healthy diet;  Prophylaxis: Xarelto for DVT treatment. Senna for constipation prophylaxis. Protonix  Disposition: Place in observation under care of [facility]. Telemetry. Admitting physician Dr.  Erin Hearing  History of Present Illness: Ashley Sims is a 63 y.o. female presenting with leg swelling. She recently underwent right hip replacement with Dr. Latanya Maudlin from [place] on September 15. Her symptoms started 3 days ago with associated pain. Her pain increased and she made an appointment to see her PCP. Her PCP ordered doppler ultrasounds and a DVT was found on exam. She was sent to the ED for further workup. She reports no chest pain or shortness of breath. She has trouble walking because of the pain. She takes Oxycontin and Percocet, which have not helped with her pain.  In the ED, she received morphine and dilaudid which helped with her pain a little. Vascular was consulted and states patient not a candidate for thrombolectomy or thrombolysis. Xarelto started.  Review Of Systems: Per HPI with the following additions: Otherwise 12 point review of systems was performed and was unremarkable.  Patient Active Problem List   Diagnosis Date Noted  . Type II or unspecified type diabetes mellitus without mention of complication, not stated as uncontrolled 07/25/2014  . Essential hypertension, benign 07/22/2014  . Depression 07/22/2014  . Hyperlipidemia 07/22/2014  . Insomnia 07/22/2014  . Rheumatoid arthritis 07/22/2014  . Type II or unspecified type diabetes mellitus without mention of complication, uncontrolled 07/22/2014  . Acute blood loss anemia 07/22/2014  . Chronic back pain 07/22/2014  . Degenerative joint disease (DJD) of hip 07/16/2014  . Diabetes 07/16/2014  . Morbid obesity 07/16/2014   Past Medical History: Past Medical History  Diagnosis Date  . Insomnia     takes Elavil nightly  . Hypertension     takes Lisinopril daily  . Diabetes mellitus without complication     takes  Metformin daily  . Depression     takes Zoloft daily  . Hyperlipidemia     takes Simvastatin daily  . History of migraine     hasn't had one in yrs  . Cancer     uterine  .  Arthritis     RA and osteo;takes Plaquenil daily and takes Methotrexate daily  . Chronic back pain     [facility];takes Oxycontin and PErcocet  . Joint pain   . Joint swelling   . GERD (gastroesophageal reflux disease)     takes Protonix daily  . History of colon polyps   . Stress incontinence   . Peripheral edema     left foot  . UTI (lower urinary tract infection)     was treated with Cipro 3wks ago   Past Surgical History: Past Surgical History  Procedure Laterality Date  . Abdominal hysterectomy    . Cesarean section    . Breast biopsy    . Colonoscopy    . Total hip arthroplasty Right 07/16/2014    DR DALLDORF  . Total hip arthroplasty Right 07/16/2014    Procedure: TOTAL HIP ARTHROPLASTY ANTERIOR APPROACH;  Surgeon: Hessie Dibble, MD;  Location: [facility];  Service: Orthopedics;  Laterality: Right;   Social History: History  Substance Use Topics  . Smoking status: Former Research scientist (life sciences)  . Smokeless tobacco: Never Used     Comment: quit smoking 55yrs ago  . Alcohol Use: No   Additional social history: Smoking history: Quit 12 years ago 1/2 PPD for about 5 years  Please also refer to relevant sections of EMR.  Family History: History reviewed. No pertinent family history. Allergies and Medications: Allergies  Allergen Reactions  . Bee Venom     blisters  . Tramadol     Sick on the stomach   No current facility-administered medications on file prior to encounter.   Current Outpatient Prescriptions on File Prior to Encounter  Medication Sig Dispense Refill  . amitriptyline (ELAVIL) 100 MG tablet Take 100 mg by mouth at bedtime.      Marland Kitchen aspirin EC 325 MG EC tablet Take 1 tablet (325 mg total) by mouth 2 (two) times daily after a meal.  60 tablet  0  . calcium carbonate (OS-CAL) 600 MG TABS tablet Take 600 mg by mouth 2 (two) times daily with a meal.      . cholecalciferol (VITAMIN D) 1000 UNITS tablet Take 1,000 Units by mouth daily.      . folic  acid (FOLVITE) 1 MG tablet Take 1 mg by mouth daily.      . hydroxychloroquine (PLAQUENIL) 200 MG tablet Take 200 mg by mouth 2 (two) times daily.       Marland Kitchen lisinopril (PRINIVIL,ZESTRIL) 10 MG tablet Take 10 mg by mouth daily.      . metFORMIN (GLUCOPHAGE) 500 MG tablet Take 500 mg by mouth 2 (two) times daily with a meal.      . methocarbamol (ROBAXIN) 500 MG tablet Take 1 tablet (500 mg total) by mouth every 6 (six) hours as needed for muscle spasms.  50 tablet  0  . methotrexate (RHEUMATREX) 2.5 MG tablet Take 12.5 mg by mouth 2 (two) times a week. Caution:Chemotherapy. Protect from light.      . OxyCODONE (OXYCONTIN) 10 mg T12A 12 hr tablet Take 10 mg by mouth every 12 (twelve) hours.      Marland Kitchen oxyCODONE-acetaminophen (PERCOCET/ROXICET) 5-325 MG per tablet Take 1-2 tablets  by mouth every 6 (six) hours as needed for severe pain.  50 tablet  0  . sertraline (ZOLOFT) 50 MG tablet Take 50 mg by mouth daily.      . simvastatin (ZOCOR) 40 MG tablet Take 40 mg by mouth daily.        Objective: BP 108/44  Pulse 84  Temp(Src) 99 F (37.2 C) (Oral)  Resp 20  SpO2 100%  Exam: General: Well appearing in no disterss HEENT: PERRLA, EOMI, dry oropharynx, neck supple Cardiovascular: Regular rate and rhythm withou Respiratory: Clear to auscultation bilaterally without wheezing Abdomen: Soft, non-tender, obese Extremities: Right leg significant edematous up to thigh compared to left. Erythema, warmth and tenderness present. DP pulses 2+ bilaterally Neuro: Alert and oriented x3. No focal deficits  Labs and Imaging: CBC BMET   Recent Labs Lab 08/01/14 1723  HGB 9.9*  HCT 29.0*    Recent Labs Lab 08/01/14 1723  NA 134*  K 4.0  CL 99  BUN 14  CREATININE 0.70  GLUCOSE 125*     Bilateral lower extremity venous duplex completed.   Preliminary report: Right: Occlusive DVT noted in the mid posterior tibial vein coursing through the femoral and common femoral veins . No evidence of superficial  thrombosis. No Baker's cyst.Left: No evidence of DVT, superficial thrombosis, or Baker's cyst.  SLAUGHTER, [place], RVS  08/01/2014, 4:06 PM   Cordelia Poche, MD 08/01/2014, 7:23 PM PGY-2, [place] Intern pager: [phone], text pages welcome

## 2014-08-01 NOTE — ED Provider Notes (Signed)
TIME SEEN: 5:05 PM  CHIEF COMPLAINT: Right lower extremity DVT  HPI: Patient is a 62 y.o. F with history of hypertension, diabetes, hyperlipidemia, migraines, chronic back pain, depression, rheumatoid arthritis on Plaquenil and methotrexate who recently underwent right hip replacement with Dr. Latanya Maudlin September 15 presents to the emergency department with 3 days of right lower extreme swelling and pain. She had a venous ultrasound that showed an occlusive DVT from the mid posterior tibial vein to the common femoral vein. She denies any numbness or tingling of his leg. No skin changes. Denies any chest pain or shortness of breath. No fever. She is not currently on anticoagulation. She reports that she has been taking OxyContin 10 mg twice a day and Percocet for breakthrough pain. She states despite this medication she is still having significant pain.  ROS: See HPI Constitutional: no fever  Eyes: no drainage  ENT: no runny nose   Cardiovascular:  no chest pain  Resp: no SOB  GI: no vomiting GU: no dysuria Integumentary: no rash  Allergy: no hives  Musculoskeletal: no leg swelling  Neurological: no slurred speech ROS otherwise negative  PAST MEDICAL HISTORY/PAST SURGICAL HISTORY:  Past Medical History  Diagnosis Date  . Insomnia     takes Elavil nightly  . Hypertension     takes Lisinopril daily  . Diabetes mellitus without complication     takes Metformin daily  . Depression     takes Zoloft daily  . Hyperlipidemia     takes Simvastatin daily  . History of migraine     hasn't had one in yrs  . Cancer     uterine  . Arthritis     RA and osteo;takes Plaquenil daily and takes Methotrexate daily  . Chronic back pain     goes [facility];takes Oxycontin and PErcocet  . Joint pain   . Joint swelling   . GERD (gastroesophageal reflux disease)     takes Protonix daily  . History of colon polyps   . Stress incontinence   . Peripheral edema     left foot  . UTI  (lower urinary tract infection)     was treated with Cipro 3wks ago    MEDICATIONS:  Prior to Admission medications   Medication Sig Start Date End Date Taking? Authorizing Provider  amitriptyline (ELAVIL) 100 MG tablet Take 100 mg by mouth at bedtime.    Historical Provider, MD  aspirin EC 325 MG EC tablet Take 1 tablet (325 mg total) by mouth 2 (two) times daily after a meal. 07/19/14   Rich Fuchs, PA-C  calcium carbonate (OS-CAL) 600 MG TABS tablet Take 600 mg by mouth 2 (two) times daily with a meal.    Historical Provider, MD  cholecalciferol (VITAMIN D) 1000 UNITS tablet Take 1,000 Units by mouth daily.    Historical Provider, MD  folic acid (FOLVITE) 1 MG tablet Take 1 mg by mouth daily.    Historical Provider, MD  hydroxychloroquine (PLAQUENIL) 200 MG tablet Take 200 mg by mouth daily.    Historical Provider, MD  lisinopril (PRINIVIL,ZESTRIL) 10 MG tablet Take 10 mg by mouth daily.    Historical Provider, MD  metFORMIN (GLUCOPHAGE) 500 MG tablet Take 500 mg by mouth 2 (two) times daily with a meal.    Historical Provider, MD  methocarbamol (ROBAXIN) 500 MG tablet Take 1 tablet (500 mg total) by mouth every 6 (six) hours as needed for muscle spasms. 07/19/14   Rich Fuchs,  PA-C  methotrexate (RHEUMATREX) 2.5 MG tablet Take 12.5 mg by mouth 2 (two) times a week. Caution:Chemotherapy. Protect from light.    Historical Provider, MD  OxyCODONE (OXYCONTIN) 10 mg T12A 12 hr tablet Take 10 mg by mouth every 12 (twelve) hours.    Historical Provider, MD  oxyCODONE-acetaminophen (PERCOCET/ROXICET) 5-325 MG per tablet Take 1-2 tablets by mouth every 6 (six) hours as needed for severe pain. 07/19/14   Larwance Sachs Nida, PA-C  sertraline (ZOLOFT) 50 MG tablet Take 50 mg by mouth daily.    Historical Provider, MD  simvastatin (ZOCOR) 40 MG tablet Take 40 mg by mouth daily.    Historical Provider, MD    ALLERGIES:  Allergies  Allergen Reactions  . Bee Venom   . Tramadol     SOCIAL  HISTORY:  History  Substance Use Topics  . Smoking status: Former Research scientist (life sciences)  . Smokeless tobacco: Never Used     Comment: quit smoking 79yrs ago  . Alcohol Use: No    FAMILY HISTORY: History reviewed. No pertinent family history.  EXAM: BP 117/66  Pulse 83  Temp(Src) 99 F (37.2 C) (Oral)  Resp 18  SpO2 98% CONSTITUTIONAL: Alert and oriented and responds appropriately to questions. Well-appearing; well-nourished HEAD: Normocephalic EYES: Conjunctivae clear, PERRL ENT: normal nose; no rhinorrhea; moist mucous membranes; pharynx without lesions noted NECK: Supple, no meningismus, no LAD  CARD: RRR; S1 and S2 appreciated; no murmurs, no clicks, no rubs, no gallops RESP: Normal chest excursion without splinting or tachypnea; breath sounds clear and equal bilaterally; no wheezes, no rhonchi, no rales,  ABD/GI: Normal bowel sounds; non-distended; soft, non-tender, no rebound, no guarding BACK:  The back appears normal and is non-tender to palpation, there is no CVA tenderness EXT: Patient's right lower extremity is swollen from her thigh into her dorsal foot compared to her left but there are strong dopplerable PT and DP pulses in her right foot, foot is warm and well-perfused, no skin changes, surgical scar over her right hip is clean and dry and intact, there is no sign of any joint effusion or erythema or warmth or induration, she has normal sensation diffusely, Normal ROM in all joints; otherwise extremities are non-tender to palpation; no edema; normal capillary refill; no cyanosis    SKIN: Normal color for age and race; warm NEURO: Moves all extremities equally PSYCH: The patient's mood and manner are appropriate. Grooming and personal hygiene are appropriate.  MEDICAL DECISION MAKING: Patient here with an occlusive DVT to her right lower extremity. She has good pulses in his well-perfused. She is neurologically intact. No sign of infection on exam. Discussed with Dr. Scot Dock with  vascular surgery. She is not a candidate for surgical thrombectomy or thrombolysis given her recent surgery. He recommends elevation, anticoagulation and pain control. Will give morphine, Zofran. Will elevate her leg in the ED. Patient would like to start Xarelto for anticoagulation. We'll have pharmacy talked to patient. If patient's pain is controlled and she is able to ambulate, anticipate that we will discharge her home with close PCP followup. I am not concerned for pulmonary embolus. She is hemodynamically stable, no tachycardia or hypoxia and no complaints of chest pain or chest discomfort, shortness of breath.  ED PROGRESS: Patient's pain is still uncontrolled after IV medications. She is still unable to stand or ambulate. We'll admit for observation for pain control. Her PCP is with [facility].   7:19 PM  Discussed with family medicine resident for admission for pain  control.  Present is requesting that I do not been holding orders at this time and they will see the patient he deep.     [place], DO 08/01/14 1919

## 2014-08-01 NOTE — Progress Notes (Signed)
VASCULAR LAB PRELIMINARY  PRELIMINARY  PRELIMINARY  PRELIMINARY  Bilateral lower extremity venous duplex completed.    Preliminary report:  Right: Occlusive DVT noted in the mid posterior tibial vein coursing through the femoral and common femoral veins .  No evidence of superficial thrombosis.  No Baker's cyst.Left:  No evidence of DVT, superficial thrombosis, or Baker's cyst.  Virna Livengood, RVS 08/01/2014, 4:06 PM

## 2014-08-01 NOTE — ED Notes (Addendum)
DVT's from rt. Ankle up to groin.  08/01/14: Bilateral lower extremity venous duplex completed. Preliminary report: Right: Occlusive DVT noted in the mid posterior tibial vein coursing through the femoral and common femoral veins . No evidence of superficial thrombosis. No Baker's cyst.Left: No evidence of DVT, superficial thrombosis, or Baker's cyst.

## 2014-08-01 NOTE — Progress Notes (Signed)
Note has been reviewed. I agree with assessment and plan.    Hughes Better, PharmD, BCPS Clinical Pharmacist Pager: [phone] 08/01/2014 5:58 PM

## 2014-08-01 NOTE — ED Notes (Signed)
Pt refuses to ambulate while still hurting.

## 2014-08-01 NOTE — ED Notes (Signed)
Pt from home with recent right hip replacement surgery 2.5 weeks ago with swelling that started this past Tuesday. Pt noted to have swellig and reddness noted to leg. PCP states DVT to calf and up to groin of right leg. Pulses present via doppler. EDP at bedside. Denies any sob, n/v/d

## 2014-08-01 NOTE — Discharge Instructions (Addendum)
Discharge Date: 08/02/2014  Reason for Hospitalization: DVT after hip replacement surgery -We have scheduled home health PT for you to help regain your strength and help with mobility. -Please be aware of any increased SOB, chest pain, or bleeding. Return to hospital if this is a concern. -Please schedule follow-up appointments with your hip surgeon and your PCP for hospital follow-up.  New medications:  -Please take your Xarelto in this way: Continue taking the 15mg  dose until 10/22. Then on 10/23 switch to the 20mg  dose. You will be on this dose for approximately 6 months. Please take as prescribed on label!  Thank you for letting us participate in your care!    Information on my medicine - XARELTO (rivaroxaban)  WHY WAS XARELTO PRESCRIBED FOR YOU? Xarelto was prescribed to treat blood clots that may have been found in the veins of your legs (deep vein thrombosis) or in your lungs (pulmonary embolism) and to reduce the risk of them occurring again.  What do you need to know about Xarelto? The starting dose is one 15 mg tablet taken TWICE daily with food for the FIRST 21 DAYS then on (enter date)  08/23/14  the dose is changed to one 20 mg tablet taken ONCE A DAY with your evening meal.  DO NOT stop taking Xarelto without talking to the health care provider who prescribed the medication.  Refill your prescription for 20 mg tablets before you run out.  After discharge, you should have regular check-up appointments with your healthcare provider that is prescribing your Xarelto.  In the future your dose may need to be changed if your kidney function changes by a significant amount.  What do you do if you miss a dose? If you are taking Xarelto TWICE DAILY and you miss a dose, take it as soon as you remember. You may take two 15 mg tablets (total 30 mg) at the same time then resume your regularly scheduled 15 mg twice daily the next day.  If you are taking Xarelto ONCE DAILY and you  miss a dose, take it as soon as you remember on the same day then continue your regularly scheduled once daily regimen the next day. Do not take two doses of Xarelto at the same time.   Important Safety Information Xarelto is a blood thinner medicine that can cause bleeding. You should call your healthcare provider right away if you experience any of the following:   Bleeding from an injury or your nose that does not stop.   Unusual colored urine (red or dark brown) or unusual colored stools (red or black).   Unusual bruising for unknown reasons.   A serious fall or if you hit your head (even if there is no bleeding).  Some medicines may interact with Xarelto and might increase your risk of bleeding while on Xarelto. To help avoid this, consult your healthcare provider or pharmacist prior to using any new prescription or non-prescription medications, including herbals, vitamins, non-steroidal anti-inflammatory drugs (NSAIDs) and supplements.  This website has more information on Xarelto: [email or web address]-benson.com/.  For your UTI, take macrobid twice daily for 7 full days and seek care with a primary doctor if you have worsening symptoms or no improvement.

## 2014-08-01 NOTE — Progress Notes (Signed)
ANTICOAGULATION CONSULT NOTE - Initial Consult  Pharmacy Consult for rivaroxaban Indication: DVT   Allergies  Allergen Reactions  . Bee Venom   . Tramadol     Vital Signs: Temp: 99 F (37.2 C) (10/01 1646) Temp Source: Oral (10/01 1646) BP: 117/66 mmHg (10/01 1646) Pulse Rate: 83 (10/01 1646)  Labs:  Recent Labs  08/01/14 1723  HGB 9.9*  HCT 29.0*  CREATININE 0.70    The CrCl is unknown because both a height and weight (above a minimum accepted value) are required for this calculation.   Medical History: Past Medical History  Diagnosis Date  . Insomnia     takes Elavil nightly  . Hypertension     takes Lisinopril daily  . Diabetes mellitus without complication     takes Metformin daily  . Depression     takes Zoloft daily  . Hyperlipidemia     takes Simvastatin daily  . History of migraine     hasn't had one in yrs  . Cancer     uterine  . Arthritis     RA and osteo;takes Plaquenil daily and takes Methotrexate daily  . Chronic back pain     goes to [facility];takes Oxycontin and PErcocet  . Joint pain   . Joint swelling   . GERD (gastroesophageal reflux disease)     takes Protonix daily  . History of colon polyps   . Stress incontinence   . Peripheral edema     left foot  . UTI (lower urinary tract infection)     was treated with Cipro 3wks ago    Assessment: 63 y/o F w/ DVT. Pt home from right hip replacement 2.5 weeks ago. Swelling noticed 09/29.  Swelling and redness appreciated. Hgb stable.  Pt not on anticoagulation prior to admission. Anticoagulation indicated.  Goal of Therapy:  Monitor platelets while on anticoagulation   Plan:   Initiate rivaroxaban 15 mg BID w/ food x 21 days, then on 10/23 initiate rivaroxaban 20 mg once daily with food.   Carl Best 08/01/2014,5:40 PM

## 2014-08-02 LAB — URINE MICROSCOPIC-ADD ON

## 2014-08-02 LAB — BASIC METABOLIC PANEL
ANION GAP: 10 (ref 5–15)
BUN: 12 mg/dL (ref 6–23)
CALCIUM: 9.2 mg/dL (ref 8.4–10.5)
CHLORIDE: 97 meq/L (ref 96–112)
CO2: 28 meq/L (ref 19–32)
Creatinine, Ser: 0.83 mg/dL (ref 0.50–1.10)
GFR calc Af Amer: 85 mL/min — ABNORMAL LOW (ref >90)
GFR calc non Af Amer: 73 mL/min — ABNORMAL LOW (ref >90)
Glucose, Bld: 136 mg/dL — ABNORMAL HIGH (ref 70–99)
POTASSIUM: 3.9 meq/L (ref 3.7–5.3)
SODIUM: 135 meq/L — AB (ref 137–147)

## 2014-08-02 LAB — HEPATIC FUNCTION PANEL
ALT: 6 U/L (ref 0–35)
AST: 14 U/L (ref 0–37)
Albumin: 2.8 g/dL — ABNORMAL LOW (ref 3.5–5.2)
Alkaline Phosphatase: 99 U/L (ref 39–117)
Bilirubin, Direct: <0.2 mg/dL (ref 0.0–0.3)
TOTAL PROTEIN: 6.4 g/dL (ref 6.0–8.3)
Total Bilirubin: 0.5 mg/dL (ref 0.3–1.2)

## 2014-08-02 LAB — URINALYSIS, ROUTINE W REFLEX MICROSCOPIC
BILIRUBIN URINE: NEGATIVE
Glucose, UA: NEGATIVE mg/dL
Ketones, ur: NEGATIVE mg/dL
NITRITE: POSITIVE — AB
Protein, ur: NEGATIVE mg/dL
SPECIFIC GRAVITY, URINE: 1.006 (ref 1.005–1.030)
UROBILINOGEN UA: 0.2 mg/dL (ref 0.0–1.0)
pH: 5.5 (ref 5.0–8.0)

## 2014-08-02 LAB — CBC
HCT: 25.3 % — ABNORMAL LOW (ref 36.0–46.0)
Hemoglobin: 8.1 g/dL — ABNORMAL LOW (ref 12.0–15.0)
MCH: 30.7 pg (ref 26.0–34.0)
MCHC: 32 g/dL (ref 30.0–36.0)
MCV: 95.8 fL (ref 78.0–100.0)
PLATELETS: 315 10*3/uL (ref 150–400)
RBC: 2.64 MIL/uL — AB (ref 3.87–5.11)
RDW: 15.9 % — ABNORMAL HIGH (ref 11.5–15.5)
WBC: 8 10*3/uL (ref 4.0–10.5)

## 2014-08-02 LAB — RETICULOCYTES
RBC.: 2.69 MIL/uL — AB (ref 3.87–5.11)
Retic Count, Absolute: 64.6 10*3/uL (ref 19.0–186.0)
Retic Ct Pct: 2.4 % (ref 0.4–3.1)

## 2014-08-02 LAB — HEMOGLOBIN A1C
Hgb A1c MFr Bld: 7 % — ABNORMAL HIGH (ref <5.7)
Mean Plasma Glucose: 154 mg/dL — ABNORMAL HIGH (ref <117)

## 2014-08-02 LAB — GLUCOSE, CAPILLARY
GLUCOSE-CAPILLARY: 126 mg/dL — AB (ref 70–99)
Glucose-Capillary: 107 mg/dL — ABNORMAL HIGH (ref 70–99)

## 2014-08-02 MED ORDER — NITROFURANTOIN MONOHYD MACRO 100 MG PO CAPS: 100.0000 mg | ORAL_CAPSULE | Freq: Two times a day (BID) | ORAL | Status: DC

## 2014-08-02 MED ORDER — FERROUS SULFATE 325 (65 FE) MG PO TABS
325.0000 mg | ORAL_TABLET | Freq: Two times a day (BID) | ORAL | Status: DC
  Filled 2014-08-02 (×2): qty 1

## 2014-08-02 MED ORDER — FERROUS SULFATE 325 (65 FE) MG PO TABS
325.0000 mg | ORAL_TABLET | Freq: Every day | ORAL | Status: DC
  Administered 2014-08-02: 325 mg via ORAL
  Filled 2014-08-02 (×2): qty 1

## 2014-08-02 MED ORDER — RIVAROXABAN 15 MG PO TABS: 15.0000 mg | ORAL_TABLET | Freq: Two times a day (BID) | ORAL | Status: DC

## 2014-08-02 MED ORDER — FERROUS SULFATE 325 (65 FE) MG PO TABS: 325.0000 mg | ORAL_TABLET | Freq: Two times a day (BID) | ORAL | Status: AC

## 2014-08-02 MED ORDER — RIVAROXABAN 20 MG PO TABS: 20.0000 mg | ORAL_TABLET | Freq: Every day | ORAL | Status: DC

## 2014-08-02 MED ORDER — RIVAROXABAN 15 MG PO TABS
15.0000 mg | ORAL_TABLET | Freq: Two times a day (BID) | ORAL | Status: DC
  Administered 2014-08-02: 15 mg via ORAL
  Filled 2014-08-02 (×3): qty 1

## 2014-08-02 MED ORDER — SENNA 8.6 MG PO TABS: 2.0000 | ORAL_TABLET | Freq: Every day | ORAL | Status: AC

## 2014-08-02 NOTE — Care Management Note (Signed)
    Page 1 of 1   08/02/2014     3:36:18 PM CARE MANAGEMENT NOTE 08/02/2014  Patient:  Ashley Sims, Ashley Sims   Account Number:  [id number]  Date Initiated:  08/02/2014  Documentation initiated by:  Marcellous Snarski  Subjective/Objective Assessment:   Pt adm on 08/01/14 with DVT RLE.  PTA, pt resides at home with husband.  Recent hip replacement; active with [facility] for HHPT.     Action/Plan:   Met with pt to discuss Xarelto coverage; Seth Ward follow up.   Anticipated DC Date:  08/02/2014   Anticipated DC Plan:  [place]  CM consult  Medication Assistance      Choice offered to / List presented to:          [facility] arranged  [facility] - 11 Patient Refused      Status of service:  Completed, signed off Medicare Important Message given?   (If response is "NO", the following Medicare IM given date fields will be blank) Date Medicare IM given:   Medicare IM given by:   Date Additional Medicare IM given:   Additional Medicare IM given by:    Discharge Disposition:  HOME/SELF CARE  Per UR Regulation:  Reviewed for med. necessity/level of care/duration of stay  If discussed at [place] of Stay Meetings, dates discussed:    Comments:  08/02/2014 Ellan Lambert, RN, BSN [phone] Pt states she will have active Medicare as of 09/01/14. Gave pt 30 day free trial card for first month of med.  Pt will then need to use Medicare supplement for Xarelto coverage.  Pt states she does not want to continue HHPT, as deductible for insurance started over on 08/01/14, and she will have to pay full price.  Pt has appt with ortho MD on 10/6--she will follow up with MD on [facility] continuation vs. starting OP therapy at his office.  Edwina at [facility] notified, and states will follow up with MD's office.

## 2014-08-02 NOTE — Progress Notes (Signed)
Pt discharge to home with husband. Discharge instruction reviewed pt VU. Pt has Xarelto card.

## 2014-08-02 NOTE — Progress Notes (Signed)
Patient ID: Ashley Sims, female   DOB: 07/15/1951, 64 y.o.   MRN: [id number] Family Medicine Teaching Service Daily Progress Note Intern Pager: [phone]  Patient name: Ashley Sims Medical record number: [id number] Date of birth: [birth year] Age: 63 y.o. Gender: female  Primary Care Provider: Tera Partridge Consultants: None Code Status: Full  Pt Overview and Major Events to Date:  10/1- Admitted for observation to telemetry. Found to have DVT.  Assessment and Plan: Ashley Sims is a 63 y.o. female presenting with acute DVT. PMH is significant for right hip replacement, diabetes, hypertension and rheumatoid arthritis.   #DVT: patient with recent hip replacement surgery(9/15). No prior history of DVT. Currently pain well controlled. Received opiates from previous surgery. No signs or symptoms of PE. Currently without chest pain or SOB.  - place in observation  -vascular consulted says patient not a candidate for thrombolectomy or thrombolysis - continue Xarelto 15mg  BID x21 days with transition to Xarelto 20mg  for 6 months  - continue home regimen of Oxycontin 10mg  BID and Percocet 1-2 tablets q4hrs  - dilaudid 0.5mg  q6 hours for breakthrough pain last given at 3am -- discontinued -PT consulted; awaiting recs - vitals per floor, with pulse ox  - keep O2 >92%  - EKG pending  # Hypertension: soft blood pressures currently(100-120/33-57). Took lisinopril today. Asymptomatic  - holding home lisinopril  - monitor vitals   # Diabetes mellitus, type 2: currently on metformin only  - continue metformin  - SSI  - A1C pending  # CAD: patient on aspirin 325mg  BID  - continue aspirin 325mg  daily  - simvastatin 40mg  daily   # Anemia:  Anemia occurred post-op with new baseline being 9-10; previously 13. Currently 8.1 drop from 9.9. Denies any symptoms or bleeding.  - ferrous sulfate 325mg  BID -will monitor closely  -reticulocyte count ordered.  # Rheumatoid arthritis  -  methotrexate 12.5mg  twice weekly on Saturday and Sunday - hydroxychloroquine 200mg  BID   # Depression  - continue zoloft   # Viatmin D deficiency  - continue vitamin D and calcium    FEN/GI: Heart healthy diet Prophylaxis: Xarelto for DVT treatment. Senna for constipation prophylaxis. Protonix  Disposition: Plan for discharge home today pending PT recs.   Subjective:  Patient doing well this morning. She is currently not endorsing any severe pain. She says it is well controlled on current pain medicine. No events overnight.  Objective: Temp:  [97.8 F (36.6 C)-99 F (37.2 C)] 97.8 F (36.6 C) (10/02 0443) Pulse Rate:  [65-98] 89 (10/02 0443) Resp:  [12-23] 18 (10/02 0443) BP: (100-120)/(33-66) 110/54 mmHg (10/02 0443) SpO2:  [93 %-100 %] 97 % (10/02 0443) Weight:  [230 lb 6.1 oz (104.5 kg)] 230 lb 6.1 oz (104.5 kg) (10/01 2215) Physical Exam: General: Well appearing in no distress. Alert and cooperative.  HEENT: EOMI, neck supple  Cardiovascular: Regular rate and rhythm without m/r/g Respiratory: Clear to auscultation bilaterally without wheezing  Abdomen: Soft, non-tender, obese  Extremities: Right leg significant edematous up to thigh compared to left. Erythema, warmth and tenderness present. DP pulses 2+ bilaterally  Neuro: Alert and oriented x3. No focal deficits  Laboratory:  Recent Labs Lab 08/01/14 1723 08/02/14 0315  WBC  --  8.0  HGB 9.9* 8.1*  HCT 29.0* 25.3*  PLT  --  315    Recent Labs Lab 08/01/14 1723 08/02/14 0315  NA 134* 135*  K 4.0 3.9  CL 99 97  CO2  --  28  BUN 14  12  CREATININE 0.70 0.83  CALCIUM  --  9.2  PROT  --  6.4  BILITOT  --  0.5  ALKPHOS  --  99  ALT  --  6  AST  --  14  GLUCOSE 125* 136*   A1C pending Albumin 2.8L   Imaging/Diagnostic Tests: Bilateral lower extremity venous duplex Preliminary report: Right: Occlusive DVT noted in the mid posterior tibial vein coursing through the femoral and common femoral veins .  No evidence of superficial thrombosis. No Baker's cyst.Left: No evidence of DVT, superficial thrombosis, or Baker's cyst.    Katheren Shams, DO 08/02/2014, 7:18 AM PGY-1, [facility] Intern pager: [phone], text pages welcome

## 2014-08-02 NOTE — Progress Notes (Signed)
Nutrition Brief Note  Patient identified on the Malnutrition Screening Tool (MST) Report for recent weight lost without trying and eating poorly because of a decreased appetite.  Per wt readings below, patient has had a 3% weight loss x 1 month; not significant for time frame and desirable given obesity.  Wt Readings from Last 15 Encounters:  08/01/14 230 lb 6.1 oz (104.5 kg)  07/26/14 235 lb 12.8 oz (106.958 kg)  07/22/14 235 lb 12.8 oz (106.958 kg)  07/16/14 236 lb (107.049 kg)  07/16/14 236 lb (107.049 kg)  07/05/14 236 lb 2 oz (107.106 kg)    Body mass index is 39.53 kg/(m^2). Patient meets criteria for Obesity Class II based on current BMI.   Current diet order is Heart Healthy, patient's average meal consumption is approximately 70% at this time. Labs and medications reviewed.   No nutrition interventions warranted at this time. If nutrition issues arise, please consult RD.   Arthur Holms, RD, LDN Pager #: [phone] After-Hours Pager #: [phone]

## 2014-08-02 NOTE — H&P (Addendum)
Family Medicine Teaching Service Attending Note  I interviewed and examined patient Ashley Sims and reviewed their tests and x-rays.  I discussed with Dr. Lonny Prude and reviewed their note for today.  I agree with their assessment and plan.     Additionally  Feels better this am less leg pain no shortness of breath or chest pain  R leg diffusely swollen and warm.  No skin signs of infection or nodes  Control pain with oral analgesics and keep leg elevated whenever resting  Make sure bowels are soft and consider bowel regimen with prolonged increased narcotics  Consider Fe twice daily give likely hood of Fe def after surgery  Ensure has age appropriatre cancer screening  Stable for discharge when can do ADLS

## 2014-08-02 NOTE — Progress Notes (Signed)
UR completed 

## 2014-08-02 NOTE — Evaluation (Signed)
Physical Therapy Evaluation Patient Details Name: Ashley Sims MRN: [id number] DOB: 1951/08/10 Today's Date: 08/02/2014   History of Present Illness  Pt adm with RLE DVT after recent direct anterior  rt THA.  Clinical Impression  Pt doing well with mobility and can return home and resume her HHPT from PT standpoint. No further acute PT needed.    Follow Up Recommendations Home health PT;Supervision - Intermittent    Equipment Recommendations  None recommended by PT    Recommendations for Other Services       Precautions / Restrictions Precautions Precautions: Anterior Hip Precaution Comments: No restrictions Restrictions RLE Weight Bearing: Weight bearing as tolerated      Mobility  Bed Mobility Overal bed mobility: Needs Assistance Bed Mobility: Supine to Sit;Sit to Supine     Supine to sit: Min assist Sit to supine: Min assist   General bed mobility comments: assist for rt leg  Transfers Overall transfer level: Modified independent Equipment used: 4-wheeled walker Transfers: Sit to/from Stand Sit to Stand: Modified independent (Device/Increase time)            Ambulation/Gait Ambulation/Gait assistance: Supervision (for IV pole only) Ambulation Distance (Feet): 175 Feet (1 sitting rest break) Assistive device: 4-wheeled walker Gait Pattern/deviations: Step-through pattern;Decreased stance time - right Gait velocity: decreased      Stairs            Wheelchair Mobility    Modified Rankin (Stroke Patients Only)       Balance Overall balance assessment: No apparent balance deficits (not formally assessed)                                           Pertinent Vitals/Pain Pain Assessment: 0-10 Pain Score: 4  Pain Location: rt leg Pain Descriptors / Indicators: Aching;Sore Pain Intervention(s): Premedicated before session;Repositioned    Home Living Family/patient expects to be discharged to:: Private residence Living  Arrangements: Spouse/significant other Available Help at Discharge: Family;Available 24 hours/day Type of Home: House Home Access: Level entry     Home Layout: One level Home Equipment: Walker - 4 wheels;Bedside commode;Walker - 2 wheels;Shower seat - built in;Adaptive equipment;Grab bars - toilet;Grab bars - tub/shower      Prior Function     Gait / Transfers Assistance Needed: amb with rollator without assist  ADL's / Homemaking Assistance Needed: Required assist with donning socks/shoes. Not doing any cooking/cleaning.         Hand Dominance   Dominant Hand: Right    Extremity/Trunk Assessment   Upper Extremity Assessment: Overall WFL for tasks assessed           Lower Extremity Assessment: RLE deficits/detail RLE Deficits / Details: grossly 3/5. Limited further by pain.       Communication      Cognition Arousal/Alertness: Awake/alert Behavior During Therapy: WFL for tasks assessed/performed Overall Cognitive Status: Within Functional Limits for tasks assessed                      General Comments      Exercises        Assessment/Plan    PT Assessment All further PT needs can be met in the next venue of care  PT Diagnosis Difficulty walking;Generalized weakness;Acute pain   PT Problem List Decreased strength;Decreased activity tolerance;Decreased mobility;Pain;Obesity  PT Treatment Interventions     PT Goals (Current goals can  be found in the Care Plan section) Acute Rehab PT Goals PT Goal Formulation: No goals set, d/c therapy    Frequency     Barriers to discharge        Co-evaluation               End of Session   Activity Tolerance: Patient tolerated treatment well Patient left: in bed;with call bell/phone within reach Nurse Communication: Mobility status         Time: 9914-4458 PT Time Calculation (min): 24 min   Charges:   PT Evaluation $Initial PT Evaluation Tier I: 1 Procedure PT Treatments $Gait  Training: 8-22 mins   PT G Codes:          Ashle Stief 08/27/2014, 10:15 AM  [facility] PT [phone]

## 2014-08-02 NOTE — Discharge Summary (Signed)
Ashley Sims Discharge Summary  Patient name: Ashley Sims Medical record number: [id number] Date of birth: [birth year] Age: 63 y.o. Gender: female Date of Admission: 08/01/2014  Date of Discharge: 08/02/2014 Admitting Physician: Lind Covert, MD  Primary Care Provider: Tera Partridge Consultants: None  Indication for Hospitalization: DVT  Discharge Diagnoses/Problem List:  Patient Active Problem List   Diagnosis Date Noted  . DVT (deep venous thrombosis) 08/01/2014  . Type II or unspecified type diabetes mellitus without mention of complication, not stated as uncontrolled 07/25/2014  . Essential hypertension, benign 07/22/2014  . Depression 07/22/2014  . Hyperlipidemia 07/22/2014  . Insomnia 07/22/2014  . Rheumatoid arthritis 07/22/2014  . Type II or unspecified type diabetes mellitus without mention of complication, uncontrolled 07/22/2014  . Acute blood loss anemia 07/22/2014  . Chronic back pain 07/22/2014  . Degenerative joint disease (DJD) of hip 07/16/2014  . Diabetes 07/16/2014  . Morbid obesity 07/16/2014    Disposition: Discharge home with [facility] PT and supervision  Discharge Condition: Stable  Discharge Exam: See Progress Note   Brief Sims Course:  Ashley Sims is a 63 y.o. female who presented with acute DVT s/p right hip replacement surgery. PMH is significant for diabetes, hypertension, CAD, and rheumatoid arthritis. Patient was found to have fiDVT in PCP office. Was sent to ED for further work-up and pain control. Patient had recent hip replacement surgery on 07/16/2014. No prior history of DVT. Vascular was consulted and said patient not a candidate for thrombolectomy or thrombolysis. Patient was started on Xarelto 15mg  BID x21 days with transition to Xarelto 20mg  for 6 months. Pain was controlled with home regimen of Oxycontin and Percocet with Dilaudid for breakthrough pain. PT was consulted and suggested [facility] PT with  intermittent supervision.  Prior to discharge, nurse called saying that she ordered a UA for patient due to patient complaining of symptoms concerning of UTI. Care team was unaware of this and did not know about this until after the UA came back positive for UTI. Patient was given Macrobid at discharge.   For management of patients other problems, please see below: Hypertension: soft blood pressures (100-120/33-57). Held home lisinopril. Diabetes mellitus, type 2: Continued home metformin; SSI  CAD: Patient on aspirin 325mg  BID at home but gave aspirin 325mg  daily. Continued simvastatin 40mg  daily  Anemia: Anemia occurred post-op with new baseline being 9-10; previously 13. Ferrous sulfate 325mg  BID initiated. Rheumatoid arthritis: Continued hydroxychloroquine 200mg  BID  Depression: Continued Zoloft  Viatmin D deficiency: Continued Vitamin D and Calcium    Issues for Follow Up:  -Reconsider starting BP medication; patient had soft pressures in the Sims and lisinopril was held. -Monitor for bleeding on Xarelto -F/u on anemia  Significant Procedures: None  Significant Labs and Imaging:   Recent Labs Lab 08/01/14 1723 08/02/14 0315  WBC  --  8.0  HGB 9.9* 8.1*  HCT 29.0* 25.3*  PLT  --  315    Recent Labs Lab 08/01/14 1723 08/02/14 0315  NA 134* 135*  K 4.0 3.9  CL 99 97  CO2  --  28  GLUCOSE 125* 136*  BUN 14 12  CREATININE 0.70 0.83  CALCIUM  --  9.2  ALKPHOS  --  99  AST  --  14  ALT  --  6  ALBUMIN  --  2.8*   A1C = 7.0  Urinalysis    Component Value Date/Time   COLORURINE YELLOW 08/02/2014 0906   APPEARANCEUR CLOUDY* 08/02/2014 0906  LABSPEC 1.006 08/02/2014 0906   PHURINE 5.5 08/02/2014 0906   GLUCOSEU NEGATIVE 08/02/2014 0906   HGBUR SMALL* 08/02/2014 0906   BILIRUBINUR NEGATIVE 08/02/2014 0906   KETONESUR NEGATIVE 08/02/2014 0906   PROTEINUR NEGATIVE 08/02/2014 0906   UROBILINOGEN 0.2 08/02/2014 0906   NITRITE POSITIVE* 08/02/2014 0906   LEUKOCYTESUR  LARGE* 08/02/2014 0906    Results/Tests Pending at Time of Discharge: None  Discharge Medications:    Medication List    STOP taking these medications       lisinopril 10 MG tablet  Commonly known as:  PRINIVIL,ZESTRIL      TAKE these medications       amitriptyline 100 MG tablet  Commonly known as:  ELAVIL  Take 100 mg by mouth at bedtime.     aspirin 325 MG EC tablet  Take 1 tablet (325 mg total) by mouth 2 (two) times daily after a meal.     calcium carbonate 600 MG Tabs tablet  Commonly known as:  OS-CAL  Take 600 mg by mouth 2 (two) times daily with a meal.     cholecalciferol 1000 UNITS tablet  Commonly known as:  VITAMIN D  Take 1,000 Units by mouth daily.     ferrous sulfate 325 (65 FE) MG tablet  Take 1 tablet (325 mg total) by mouth 2 (two) times daily with a meal.     folic acid 1 MG tablet  Commonly known as:  FOLVITE  Take 1 mg by mouth daily.     hydroxychloroquine 200 MG tablet  Commonly known as:  PLAQUENIL  Take 200 mg by mouth 2 (two) times daily.     metFORMIN 500 MG tablet  Commonly known as:  GLUCOPHAGE  Take 500 mg by mouth 2 (two) times daily with a meal.     methocarbamol 500 MG tablet  Commonly known as:  ROBAXIN  Take 1 tablet (500 mg total) by mouth every 6 (six) hours as needed for muscle spasms.     methotrexate 2.5 MG tablet  Commonly known as:  RHEUMATREX  Take 12.5 mg by mouth 2 (two) times a week. Caution:Chemotherapy. Protect from light.     nitrofurantoin (macrocrystal-monohydrate) 100 MG capsule  Commonly known as:  MACROBID  Take 1 capsule (100 mg total) by mouth 2 (two) times daily.     oxyCODONE-acetaminophen 5-325 MG per tablet  Commonly known as:  PERCOCET/ROXICET  Take 1-2 tablets by mouth every 6 (six) hours as needed for severe pain.     OXYCONTIN 10 mg T12a 12 hr tablet  Generic drug:  OxyCODONE  Take 10 mg by mouth every 12 (twelve) hours.     pantoprazole 40 MG tablet  Commonly known as:  PROTONIX  Take  40 mg by mouth daily.     Rivaroxaban 15 MG Tabs tablet  Commonly known as:  XARELTO  Take 1 tablet (15 mg total) by mouth 2 (two) times daily with a meal.     rivaroxaban 20 MG Tabs tablet  Commonly known as:  XARELTO  Take 1 tablet (20 mg total) by mouth daily with supper.  Start taking on:  08/23/2014     senna 8.6 MG Tabs tablet  Commonly known as:  SENOKOT  Take 2 tablets (17.2 mg total) by mouth daily.     sertraline 50 MG tablet  Commonly known as:  ZOLOFT  Take 50 mg by mouth daily.     simvastatin 40 MG tablet  Commonly known as:  ZOCOR  Take 40 mg by mouth daily.        Discharge Instructions: Please refer to Patient Instructions section of EMR for full details.  Patient was counseled important signs and symptoms that should prompt return to medical care, changes in medications, dietary instructions, activity restrictions, and follow up appointments.   Follow-Up Appointments: Follow-up Information   Schedule an appointment as soon as possible for a visit with Hessie Dibble, MD. (Keep regular follow-up appointment.)    Specialty:  Orthopedic Surgery   Contact information:   [place] [address] [phone]       Schedule an appointment as soon as possible for a visit with WATTERSON,PATRICK, PA-C. (For Sims follow-up)    Specialty:  Internal Medicine   Contact information:   [place] [address] [place], DO 08/02/2014, 7:57 PM PGY-1, [place]

## 2014-08-02 NOTE — Progress Notes (Signed)
Discussed in rounds.  Seen today by attending, Dr. Erin Hearing.  Agree with Dr. Gerarda Fraction' documentation and management.

## 2014-08-02 NOTE — Progress Notes (Signed)
Called by RN stating patient had had dysuria and irritation with voiding but no fevers and chills, she had ordered UA on the patient which was nitrite positive with large leuks and many bacteria. Will send rx for macrobid BID x 7 days. Return precautions discussed over phone.  Urinalysis    Component Value Date/Time   COLORURINE YELLOW 08/02/2014 0906   APPEARANCEUR CLOUDY* 08/02/2014 0906   LABSPEC 1.006 08/02/2014 0906   PHURINE 5.5 08/02/2014 0906   GLUCOSEU NEGATIVE 08/02/2014 0906   HGBUR SMALL* 08/02/2014 0906   BILIRUBINUR NEGATIVE 08/02/2014 0906   KETONESUR NEGATIVE 08/02/2014 0906   PROTEINUR NEGATIVE 08/02/2014 0906   UROBILINOGEN 0.2 08/02/2014 0906   NITRITE POSITIVE* 08/02/2014 0906   LEUKOCYTESUR LARGE* 08/02/2014 1188     Hilton Sinclair, MD

## 2014-08-03 NOTE — Discharge Summary (Signed)
Reviewed: Agree with DC and with Dr. Gerarda Fraction' documentation and management.

## 2014-08-06 NOTE — Progress Notes (Addendum)
PT Late G code entry   08/02/14 1017  PT G-Codes **NOT FOR INPATIENT CLASS**  Functional Assessment Tool Used clinical judgement  Functional Limitation Mobility: Walking and moving around  Mobility: Walking and Moving Around Current Status ([id number]) CI  Mobility: Walking and Moving Around Goal Status [phone]) CI  Mobility: Walking and Moving Around Discharge Status [phone]) CI   [facility] PT [phone]

## 2014-08-23 ENCOUNTER — Other Ambulatory Visit: Payer: Self-pay | Admitting: Adult Health

## 2014-09-30 ENCOUNTER — Other Ambulatory Visit: Payer: Self-pay | Admitting: Adult Health

## 2014-09-30 ENCOUNTER — Other Ambulatory Visit: Payer: Self-pay | Admitting: Obstetrics and Gynecology

## 2014-11-07 ENCOUNTER — Other Ambulatory Visit: Payer: Self-pay | Admitting: Adult Health

## 2014-11-28 ENCOUNTER — Other Ambulatory Visit: Payer: Self-pay | Admitting: Oncology

## 2014-11-28 DIAGNOSIS — D509 Iron deficiency anemia, unspecified: Secondary | ICD-10-CM

## 2014-11-28 DIAGNOSIS — I82409 Acute embolism and thrombosis of unspecified deep veins of unspecified lower extremity: Secondary | ICD-10-CM

## 2014-12-23 ENCOUNTER — Encounter: Payer: Self-pay | Admitting: Oncology

## 2014-12-23 ENCOUNTER — Ambulatory Visit (INDEPENDENT_AMBULATORY_CARE_PROVIDER_SITE_OTHER): Payer: Medicare Other | Admitting: Oncology

## 2014-12-23 VITALS — BP 118/57 | HR 64 | Temp 97.7°F | Ht 64.0 in | Wt 232.9 lb

## 2014-12-23 DIAGNOSIS — D509 Iron deficiency anemia, unspecified: Secondary | ICD-10-CM

## 2014-12-23 DIAGNOSIS — I82401 Acute embolism and thrombosis of unspecified deep veins of right lower extremity: Secondary | ICD-10-CM

## 2014-12-23 DIAGNOSIS — Z7901 Long term (current) use of anticoagulants: Secondary | ICD-10-CM

## 2014-12-23 DIAGNOSIS — Z87891 Personal history of nicotine dependence: Secondary | ICD-10-CM

## 2014-12-23 DIAGNOSIS — I82409 Acute embolism and thrombosis of unspecified deep veins of unspecified lower extremity: Secondary | ICD-10-CM

## 2014-12-23 LAB — IRON AND TIBC
%SAT: 9 % — AB (ref 20–55)
Iron: 34 ug/dL — ABNORMAL LOW (ref 42–145)
TIBC: 389 ug/dL (ref 250–470)
UIBC: 355 ug/dL (ref 125–400)

## 2014-12-23 LAB — D-DIMER, QUANTITATIVE: D-Dimer, Quant: 0.45 ug/mL-FEU (ref 0.00–0.48)

## 2014-12-23 LAB — CBC WITH DIFFERENTIAL/PLATELET
BASOS ABS: 0 10*3/uL (ref 0.0–0.1)
Basophils Relative: 0 % (ref 0–1)
EOS ABS: 0.1 10*3/uL (ref 0.0–0.7)
EOS PCT: 2 % (ref 0–5)
HCT: 37.3 % (ref 36.0–46.0)
Hemoglobin: 11.9 g/dL — ABNORMAL LOW (ref 12.0–15.0)
LYMPHS ABS: 0.6 10*3/uL — AB (ref 0.7–4.0)
LYMPHS PCT: 11 % — AB (ref 12–46)
MCH: 27.5 pg (ref 26.0–34.0)
MCHC: 31.9 g/dL (ref 30.0–36.0)
MCV: 86.3 fL (ref 78.0–100.0)
MONO ABS: 0.6 10*3/uL (ref 0.1–1.0)
MONOS PCT: 10 % (ref 3–12)
MPV: 9.4 fL (ref 8.6–12.4)
NEUTROS PCT: 77 % (ref 43–77)
Neutro Abs: 4.2 10*3/uL (ref 1.7–7.7)
Platelets: 332 10*3/uL (ref 150–400)
RBC: 4.32 MIL/uL (ref 3.87–5.11)
RDW: 14.5 % (ref 11.5–15.5)
WBC: 5.5 10*3/uL (ref 4.0–10.5)

## 2014-12-23 NOTE — Progress Notes (Signed)
Patient ID: Ashley Sims, female   DOB: 29-Mar-1951, 64 y.o.   MRN: [id number] New Patient Hematology   Ashley Sims [id number] Sep 28, 1951 64 y.o. 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 She underwent a right total hip replacement on 07/16/2014. She developed pain and swelling of her right lower extremity. Venous Doppler study done 08/01/2014 showed extensive venous thrombosis involving the common femoral, femoral, popliteal, and tibial veins. She was hospitalized overnight and then sent home on Xarelto which she continues at this time. She's had persistent discomfort and paresthesias primarily affecting the right anterior thigh and inguinal region. She has had persistent swelling of the right calf and thigh. She has had difficulty wearing elastic stockings. She has ongoing pain and disability related to disease in her contralateral hip which also needs to be replaced. Of interest,  she has an identical twin sister who also has bilateral hip problems and has already had a hip replacement.  She has a number of additional risk factors for clotting. She is overweight. She has been under treatment for the last 5 years for rheumatoid arthritis with Plaquenil and weekly methotrexate which was recently discontinued by her rheumatologist when x-ray studies were reported to her as being stable. She was diagnosed with a stage III, grade 2, adenocarcinoma of the endometrium status post TAH/BSO/lymph node sampling in April 2013 treated with postoperative chemotherapy with carboplatinum plus Taxol in addition to radiation therapy. She remains in remission at this time. She still has a Port-A-Cath infusion device in place. She is a former smoker who stopped about 15 years ago.  There is no  family history of clotting in her parents, her identical twin sister, or her older brother now age 65.   PMH: Past Medical History  Diagnosis Date  . Insomnia     takes Elavil nightly  . Hypertension     takes Lisinopril daily  . Depression     takes Zoloft daily  . Hyperlipidemia     takes Simvastatin daily  . Chronic back pain     [facility];takes Oxycontin and Percocet  . Joint pain   . Joint swelling   . GERD (gastroesophageal reflux disease)     takes Protonix daily  . History of colon polyps   . Stress incontinence   . Peripheral edema     left foot  . UTI (lower urinary tract infection)     was treated with Cipro 3wks ago  . Type II diabetes mellitus     takes Metformin daily  . DVT (deep venous thrombosis) 08/01/2014    RLE  . Migraine     "haven't had a migraine since 1970's"  . Rheumatoid arthritis     RA and osteo;takes Plaquenil daily and takes Methotrexate daily  . Osteoarthritis   . Uterine cancer   Episode of herpes zoster affecting a left thoracic dermatome November 2015. No history of MI, stomach ulcers, hepatitis, yellow jaundice, mononucleosis, thyroid disease, seizure, stroke.  Past Surgical History  Procedure Laterality Date  . Cesarean section  1978  . Colonoscopy    . Total hip arthroplasty Right 07/16/2014    DR DALLDORF  . Total hip arthroplasty Right 07/16/2014    Procedure: TOTAL HIP ARTHROPLASTY ANTERIOR APPROACH;  Surgeon: Hessie Dibble, MD;  Location: [place];  Service: Orthopedics;  Laterality: Right;  . Cholecystectomy  1990's  . Joint replacement    . Breast biopsy  1980's  . Abdominal hysterectomy for stage III endometrial cancer   01/2012    "[facility]"  . Tubal ligation  1978  Placement of a Port-A-Cath infusion device  Allergies: Allergies  Allergen Reactions  . Bee Venom     blisters  . Tramadol     Sick on the stomach    Medications:  Current outpatient prescriptions:  .  DULoxetine  (CYMBALTA) 60 MG capsule, Take 60 mg by mouth daily., Disp: , Rfl:  .  amitriptyline (ELAVIL) 100 MG tablet, Take 100 mg by mouth at bedtime., Disp: , Rfl:  .  aspirin EC 325 MG EC tablet, Take 1 tablet (325 mg total) by mouth 2 (two) times daily after a meal., Disp: 60 tablet, Rfl: 0 .  calcium carbonate (OS-CAL) 600 MG TABS tablet, Take 600 mg by mouth 2 (two) times daily with a meal., Disp: , Rfl:  .  cholecalciferol (VITAMIN D) 1000 UNITS tablet, Take 1,000 Units by mouth daily., Disp: , Rfl:  .  ferrous sulfate 325 (65 FE) MG tablet, Take 1 tablet (325 mg total) by mouth 2 (two) times daily with a meal., Disp: 60 tablet, Rfl: 3 .  folic acid (FOLVITE) 1 MG tablet, Take 1 mg by mouth daily., Disp: , Rfl:  .  hydroxychloroquine (PLAQUENIL) 200 MG tablet, Take 200 mg by mouth 2 (two) times daily. , Disp: , Rfl:  .  metFORMIN (GLUCOPHAGE) 500 MG tablet, Take 500 mg by mouth 2 (two) times daily with a meal., Disp: , Rfl:  .  methocarbamol (ROBAXIN) 500 MG tablet, Take 1 tablet (500 mg total) by mouth every 6 (six) hours as needed for muscle spasms., Disp: 50 tablet, Rfl: 0 .  methotrexate (RHEUMATREX) 2.5 MG tablet, Take 12.5 mg by mouth 2 (two) times a week. Caution:Chemotherapy. Protect from light., Disp: , Rfl:  .  OxyCODONE (OXYCONTIN) 10 mg T12A 12 hr tablet, Take 10 mg by mouth every 12 (twelve) hours., Disp: , Rfl:  .  oxyCODONE-acetaminophen (PERCOCET/ROXICET) 5-325 MG per tablet, Take 1-2 tablets by mouth every 6 (six) hours as needed for severe pain., Disp: 50 tablet, Rfl: 0 .  pantoprazole (PROTONIX) 40 MG tablet, Take 40 mg by mouth daily., Disp: , Rfl:  .  rivaroxaban (XARELTO) 20 MG TABS tablet, Take 1 tablet (20 mg total) by mouth daily with supper., Disp: 90 tablet, Rfl: 2 .  senna (SENOKOT) 8.6 MG TABS tablet, Take 2 tablets (17.2 mg total) by mouth daily., Disp: 120 each, Rfl: 0 .  sertraline (ZOLOFT) 50 MG tablet, Take 50 mg by mouth daily., Disp: , Rfl:  .  simvastatin (ZOCOR) 40  MG tablet, Take 40 mg by mouth daily., Disp: , Rfl:    Social History: retired Dealer for an [employer]  reports that she quit smoking about 15 years ago.  history.she does not drink alcohol or use illicit drugs. She has a daughter age 68 with multiple sclerosis. A son age 78 who is healthy.  Family History: Mother died at age 30 of cardiac disease. Father died age 56 following complications of a stroke. She has an identical twin sister who also has degenerative arthritis of her hips but does not have inflammatory arthritis and who has not had any blood clots. She has brother age 29 with coronary disease hypertension and status post MI.  Review of  Systems: See HPI Remaining ROS negative.  Physical Exam: Blood pressure 118/57, pulse 64, temperature 97.7 F (36.5 C), temperature source Oral, height 5\' 4"  (1.626 m), weight 232 lb 14.4 oz (105.643 kg), SpO2 96 %. Wt Readings from Last 3 Encounters:  12/23/14 232 lb 14.4 oz (105.643 kg)  08/01/14 230 lb 6.1 oz (104.5 kg)  07/26/14 235 lb 12.8 oz (106.958 kg)     General appearance: pleasant, obese, caucasian woman HENNT: Pharynx no erythema, exudate, mass, or ulcer. No thyromegaly or thyroid nodules Lymph nodes: No cervical, supraclavicular, or axillary lymphadenopathy Breasts:  Lungs: Clear to auscultation, resonant to percussion throughout Heart: Regular rhythm, no murmur, no gallop, no rub, no click, no edema Abdomen: Soft, nontender, normal bowel sounds, no mass, no organomegaly Extremities: right calf swollen, tense, tender on palpation along femoral canal; pretibial erythema Calf 44 cm R   40 cm L   Ankle 25 cm both R & L Musculoskeletal: no joint deformities GU:  Vascular: Carotid pulses 2+, no bruits, distal pulses: Dorsalis pedis unable to palpate bilaterally Neurologic: Alert, oriented, PERRLA, optic discs sharp and vessels normal, no hemorrhage or exudate, cranial nerves grossly normal, motor strength 5 over 5,  reflexes absent symmetric at the knees, 1+ symmetric at the biceps, upper body coordination normal, gait normal, Skin: No rash or ecchymosis. Patch of scaly dermatitis posterior aspect right thigh.    Lab Results: Lab Results  Component Value Date   WBC 8.0 08/02/2014   HGB 8.1* 08/02/2014   HCT 25.3* 08/02/2014   MCV 95.8 08/02/2014   PLT 315 08/02/2014     Chemistry      Component Value Date/Time   NA 135* 08/02/2014 0315   K 3.9 08/02/2014 0315   CL 97 08/02/2014 0315   CO2 28 08/02/2014 0315   BUN 12 08/02/2014 0315   CREATININE 0.83 08/02/2014 0315      Component Value Date/Time   CALCIUM 9.2 08/02/2014 0315   ALKPHOS 99 08/02/2014 0315   AST 14 08/02/2014 0315   ALT 6 08/02/2014 0315   BILITOT 0.5 08/02/2014 0315       Pathology: see discussion above   Radiological Studies: See discussion above   Impression  Provoked extensive proximal right leg venous thrombosis  Although this was a provoked event, she has a number of ongoing risk factors including persistent swelling and pain of the right lower extremity, partial immobilization secondary to advanced degenerative arthritis of the contralateral hip, underlying inflammatory arthritis in addition to degenerative arthritis, with possible contribution from lymphedema related to pelvic lymph node dissection from previous cancer surgery, and obesity. For all of these reasons, I would continue full dose anticoagulation at this time with periodic reevaluation.  She will need to have her left hip replaced. If she has significant improvement in mobility and decrease pain subsequent to that future surgery, I would reevaluate her for either discontinuation of full dose anticoagulation or modification with dose reduction or perhaps change to aspirin alone. I am going to check a baseline D-dimer today. I will see her back in 3-4 months to monitor her progress. Of note she already has a hematologist/oncologist who may be the  more appropriate person to follow her long-term.     Annia Belt, MD 12/23/2014, 2:32 PM

## 2014-12-23 NOTE — Patient Instructions (Signed)
To lab today Return visit with Dr Darnell Level in 4-5 months

## 2014-12-24 LAB — FERRITIN: Ferritin: 43 ng/mL (ref 10–291)

## 2014-12-26 ENCOUNTER — Telehealth: Payer: Self-pay | Admitting: *Deleted

## 2014-12-26 NOTE — Telephone Encounter (Signed)
-----   Message from Annia Belt, MD sent at 12/24/2014  5:02 PM EST ----- Call pt:  Hemoglobin up from 8.1 in October to 11.9. Continue iron supplement - it is working

## 2014-12-26 NOTE — Telephone Encounter (Signed)
Pt called / informed Hgb up to 11.9 from 8.1 in Oct and to continue taking her iron supplements per Dr Beryle Beams. Stated she glad and understands.

## 2015-03-01 ENCOUNTER — Other Ambulatory Visit: Payer: Self-pay | Admitting: Obstetrics and Gynecology

## 2015-03-28 ENCOUNTER — Other Ambulatory Visit: Payer: Self-pay | Admitting: Orthopaedic Surgery

## 2015-04-14 ENCOUNTER — Ambulatory Visit (INDEPENDENT_AMBULATORY_CARE_PROVIDER_SITE_OTHER): Payer: Medicare Other | Admitting: Oncology

## 2015-04-14 ENCOUNTER — Encounter: Payer: Self-pay | Admitting: Oncology

## 2015-04-14 VITALS — BP 128/61 | HR 63 | Temp 98.2°F | Ht 64.0 in | Wt 231.0 lb

## 2015-04-14 DIAGNOSIS — Z86010 Personal history of colonic polyps: Secondary | ICD-10-CM

## 2015-04-14 DIAGNOSIS — M1990 Unspecified osteoarthritis, unspecified site: Secondary | ICD-10-CM | POA: Diagnosis not present

## 2015-04-14 DIAGNOSIS — E119 Type 2 diabetes mellitus without complications: Secondary | ICD-10-CM

## 2015-04-14 DIAGNOSIS — Z8589 Personal history of malignant neoplasm of other organs and systems: Secondary | ICD-10-CM

## 2015-04-14 DIAGNOSIS — I82401 Acute embolism and thrombosis of unspecified deep veins of right lower extremity: Secondary | ICD-10-CM | POA: Diagnosis present

## 2015-04-14 DIAGNOSIS — M069 Rheumatoid arthritis, unspecified: Secondary | ICD-10-CM | POA: Diagnosis not present

## 2015-04-14 DIAGNOSIS — Z7901 Long term (current) use of anticoagulants: Secondary | ICD-10-CM

## 2015-04-14 NOTE — Patient Instructions (Signed)
Stop Xarelto 2 days before hip surgery Resume Xarelto night of surgery or next morning at your surgeon's advice Stay on Xarelto until next visit with Dr Darnell Level Return visit  8-10 weeks Lab day of visit

## 2015-04-14 NOTE — Progress Notes (Signed)
Patient ID: Ashley Sims, female   DOB: Sep 05, 1951, 64 y.o.   MRN: [id number] Hematology and Oncology Follow Up Visit  Ashley Sims [id number] 1951/01/27 64 y.o. 04/14/2015 10:11 AM   Principle Diagnosis: Encounter Diagnoses  Name Primary?  . DVT (deep venous thrombosis), right Yes  . 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 She underwent a right total hip replacement on 07/16/2014. She developed pain and swelling of her right lower extremity. Venous Doppler study done 08/01/2014 showed extensive venous thrombosis involving the common femoral, femoral, popliteal, and tibial veins. She was hospitalized overnight and then sent home on Xarelto which she continues at this time. She's had persistent discomfort and paresthesias primarily affecting the right anterior thigh and inguinal region. She has had persistent swelling of the right calf and thigh. She has had difficulty wearing elastic stockings. She has ongoing pain and disability related to disease in her contralateral hip which also needs to be replaced. Of interest, she has an identical twin sister who also has bilateral hip problems and has already had a hip replacement.  She has a number of additional risk factors for clotting. She is overweight. She has been under treatment for the last 5 years for rheumatoid arthritis with Plaquenil and weekly methotrexate which was recently discontinued by her rheumatologist when x-ray studies were reported to her as being stable. She was diagnosed with a stage III, grade 2, adenocarcinoma of the endometrium status post TAH/BSO/lymph node sampling in April 2013 treated with postoperative chemotherapy with carboplatinum plus Taxol in addition to radiation therapy. She remains in remission at this time. She still has a Port-A-Cath infusion device in place. She is a former smoker who stopped about 15 years ago.  There is no  family history of clotting in her parents, her identical twin sister, or her older brother now age 84.   Interim History:   She has ongoing problems with her right leg.  She has persistent swelling & venous stasis changes. She was treated for cellulitis 1 month ago by her primary care. She is scheduled for left hip replacement on 6/28. Her rheumatoid arthritis is now under control & Dr Ashley Sims stopped her methotrexate & Plaquenil.    Medications: reviewed  Allergies:  Allergies  Allergen Reactions  . Bee Venom     blisters  . Tramadol     Sick on the stomach    Review of Systems: See history of present illness Remaining ROS negative:   Physical Exam: Blood pressure 128/61, pulse 63, temperature 98.2 F (36.8 C), temperature source Oral, height 5\' 4"  (1.626 m), weight 231 lb (104.781 kg), SpO2 95 %. Wt Readings from Last 3 Encounters:  04/14/15 231 lb (104.781 kg)  12/23/14 232 lb 14.4 oz (105.643 kg)  08/01/14 230 lb 6.1 oz (104.5 kg)     General appearance: Well-nourished Caucasian woman HENNT: Pharynx no erythema, exudate, mass, or ulcer. No thyromegaly or thyroid nodules Lymph nodes: No cervical, supraclavicular, or axillary lymphadenopathy Breasts: Lungs: Clear to auscultation, resonant to percussion throughout Heart: Regular rhythm, no murmur, no gallop, no rub, no click, no edema Abdomen: Soft, nontender, normal bowel sounds, no gross mass, or organomegaly, incomplete exam patient in a wheelchair Extremities: Chronic edema, and venous stasis changes right calf. no left calf tenderness or swelling, distention of superficial veins right foot. Left calf 39 cm, right 42 Left ankle 24.5 cm, right 26 Musculoskeletal: no joint deformities GU:  Vascular: Carotid pulses  2+, no bruits,  Neurologic: Alert, oriented, PERRLA,  cranial nerves grossly normal, motor strength 5 over 5, reflexes 1+ symmetric, upper body coordination normal, gait normal, Skin: No rash or  ecchymosis  Lab Results: CBC W/Diff    Component Value Date/Time   WBC 5.5 12/23/2014 1445   RBC 4.32 12/23/2014 1445   RBC 2.69* 08/02/2014 0315   HGB 11.9* 12/23/2014 1445   HCT 37.3 12/23/2014 1445   PLT 332 12/23/2014 1445   MCV 86.3 12/23/2014 1445   MCH 27.5 12/23/2014 1445   MCHC 31.9 12/23/2014 1445   RDW 14.5 12/23/2014 1445   LYMPHSABS 0.6* 12/23/2014 1445   MONOABS 0.6 12/23/2014 1445   EOSABS 0.1 12/23/2014 1445   BASOSABS 0.0 12/23/2014 1445     Chemistry      Component Value Date/Time   NA 135* 08/02/2014 0315   K 3.9 08/02/2014 0315   CL 97 08/02/2014 0315   CO2 28 08/02/2014 0315   BUN 12 08/02/2014 0315   CREATININE 0.83 08/02/2014 0315      Component Value Date/Time   CALCIUM 9.2 08/02/2014 0315   ALKPHOS 99 08/02/2014 0315   AST 14 08/02/2014 0315   ALT 6 08/02/2014 0315   BILITOT 0.5 08/02/2014 0315       Radiological Studies: No results found.  Impression:  #1. Status post provoked extensive proximal right leg DVT following right hip replacement September 2015. Overall stable on therapeutic Xarelto but she has developed venous stasis changes of the right lower extremity. We may need to extend therapeutic anticoagulation.  #2. Planned left hip surgery 04/29/2015. She is advised to stop the Xarelto 2 days prior to surgery. She can resume the Sims of surgery if otherwise stable or the next morning. I will see her again in 6-8 weeks postop for further recommendations on duration of anticoagulation.  #3. Rheumatoid arthritis Currently quiescent. Methotrexate and Plaquenil on hold.  #4. Stage III, grade 2, adenocarcinoma of the endometrium status post TAH/BSO/lymph node sampling April 2013 followed by postoperative chemotherapy with carboplatinum plus Taxol and radiation  #5. Essential hypertension  #6. Type 2 diabetes on oral agent  #7. Advanced degenerative arthritis  #8. History of colon polyps   CC: Patient Care Team: Iva Lento, PA-C as PCP - General (Internal Medicine)   Annia Belt, MD 6/13/201610:11 AM

## 2015-04-18 ENCOUNTER — Encounter (HOSPITAL_COMMUNITY)
Admission: RE | Admit: 2015-04-18 | Discharge: 2015-04-18 | Disposition: A | Discharge status: Home / Self Care | Payer: Medicare Other | Source: Ambulatory Visit | Attending: Orthopaedic Surgery | Admitting: Orthopaedic Surgery

## 2015-04-18 ENCOUNTER — Encounter (HOSPITAL_COMMUNITY): Payer: Self-pay

## 2015-04-18 DIAGNOSIS — Z0183 Encounter for blood typing: Secondary | ICD-10-CM | POA: Diagnosis not present

## 2015-04-18 DIAGNOSIS — M1612 Unilateral primary osteoarthritis, left hip: Secondary | ICD-10-CM | POA: Diagnosis not present

## 2015-04-18 DIAGNOSIS — Z01812 Encounter for preprocedural laboratory examination: Secondary | ICD-10-CM | POA: Diagnosis not present

## 2015-04-18 LAB — BASIC METABOLIC PANEL
Anion gap: 9 (ref 5–15)
BUN: 15 mg/dL (ref 6–20)
CHLORIDE: 105 mmol/L (ref 101–111)
CO2: 26 mmol/L (ref 22–32)
CREATININE: 0.84 mg/dL (ref 0.44–1.00)
Calcium: 10.1 mg/dL (ref 8.9–10.3)
GFR calc non Af Amer: >60 mL/min (ref >60)
GLUCOSE: 117 mg/dL — AB (ref 65–99)
Potassium: 4.2 mmol/L (ref 3.5–5.1)
Sodium: 140 mmol/L (ref 135–145)

## 2015-04-18 LAB — SURGICAL PCR SCREEN
MRSA, PCR: NEGATIVE
Staphylococcus aureus: NEGATIVE

## 2015-04-18 LAB — CBC WITH DIFFERENTIAL/PLATELET
BASOS ABS: 0 10*3/uL (ref 0.0–0.1)
BASOS PCT: 0 % (ref 0–1)
Eosinophils Absolute: 0.2 10*3/uL (ref 0.0–0.7)
Eosinophils Relative: 3 % (ref 0–5)
HCT: 40.7 % (ref 36.0–46.0)
HEMOGLOBIN: 13.1 g/dL (ref 12.0–15.0)
Lymphocytes Relative: 14 % (ref 12–46)
Lymphs Abs: 0.8 10*3/uL (ref 0.7–4.0)
MCH: 28.5 pg (ref 26.0–34.0)
MCHC: 32.2 g/dL (ref 30.0–36.0)
MCV: 88.5 fL (ref 78.0–100.0)
Monocytes Absolute: 0.5 10*3/uL (ref 0.1–1.0)
Monocytes Relative: 9 % (ref 3–12)
Neutro Abs: 4.4 10*3/uL (ref 1.7–7.7)
Neutrophils Relative %: 74 % (ref 43–77)
PLATELETS: 282 10*3/uL (ref 150–400)
RBC: 4.6 MIL/uL (ref 3.87–5.11)
RDW: 15.1 % (ref 11.5–15.5)
WBC: 5.9 10*3/uL (ref 4.0–10.5)

## 2015-04-18 LAB — URINALYSIS, ROUTINE W REFLEX MICROSCOPIC
Bilirubin Urine: NEGATIVE
Glucose, UA: NEGATIVE mg/dL
Hgb urine dipstick: NEGATIVE
Ketones, ur: NEGATIVE mg/dL
LEUKOCYTES UA: NEGATIVE
NITRITE: NEGATIVE
Protein, ur: NEGATIVE mg/dL
SPECIFIC GRAVITY, URINE: 1.01 (ref 1.005–1.030)
UROBILINOGEN UA: 0.2 mg/dL (ref 0.0–1.0)
pH: 5 (ref 5.0–8.0)

## 2015-04-18 LAB — PROTIME-INR
INR: 1.15 (ref 0.00–1.49)
PROTHROMBIN TIME: 14.9 s (ref 11.6–15.2)

## 2015-04-18 LAB — GLUCOSE, CAPILLARY: GLUCOSE-CAPILLARY: 130 mg/dL — AB (ref 65–99)

## 2015-04-18 LAB — TYPE AND SCREEN
ABO/RH(D): A POS
ANTIBODY SCREEN: NEGATIVE

## 2015-04-18 LAB — APTT: APTT: 30 s (ref 24–37)

## 2015-04-18 NOTE — Progress Notes (Signed)
PCP is Dr Harriet Pho at [place]. Denies seeing a cardiologist. Denies having a card cath, states she had an echo and stress test 10 years ago. Pt states that she doesn't check her cbg's She states she was told to stop taking Xarelto 2 days before her surgery. She is being treated for ring worm. Two area noted one on her left hand and one on her right arm. ID called and states to use universal precautions, no need for contact isolation.

## 2015-04-18 NOTE — Pre-Procedure Instructions (Signed)
Kylea Berrong  04/18/2015      [id number] - [place], [place] - [address] [place] [address] Phone: [phone] Fax: [phone]    Your procedure is scheduled on June 28  Report to [facility] at 530 A.M.  Call this number if you have problems the morning of surgery:  [phone]   Remember:  Do not eat food or drink liquids after midnight.  Take these medicines the morning of surgery with A SIP OF WATER : Duloxetine (Cymbalta), gabapentin(Neurontin), Methocarbamol (Robaxin), Oxycodone (Percocet) if needed, Oxycodone (Oxycontin), Pantoprazole (Protonix), sertraline (Zoloft)  Stop/ take  rivaroxaban (Xarelto) as directed by your Dr  Stop taking Aspirin, Ibuprofen, Aleve, BC's, Goody's, Herbal medications, Fish Oil, Meloxicam   Do not wear jewelry, make-up or nail polish.  Do not wear lotions, powders, or perfumes.  You may wear deodorant.  Do not shave 48 hours prior to surgery.  Men may shave face and neck.  Do not bring valuables to the hospital.  [facility] is not responsible for any belongings or valuables.  Contacts, dentures or bridgework may not be worn into surgery.  Leave your suitcase in the car.  After surgery it may be brought to your room.  For patients admitted to the hospital, discharge time will be determined by your treatment team.  Patients discharged the day of surgery will not be allowed to drive home.    Special instructions:  [facility] - Preparing for Surgery  Before surgery, you can play an important role.  Because skin is not sterile, your skin needs to be as free of germs as possible.  You can reduce the number of germs on you skin by washing with CHG (chlorahexidine gluconate) soap before surgery.  CHG is an antiseptic cleaner which kills germs and bonds with the skin to continue killing germs even after washing.  Please DO NOT use if you have an  allergy to CHG or antibacterial soaps.  If your skin becomes reddened/irritated stop using the CHG and inform your nurse when you arrive at Short Stay.  Do not shave (including legs and underarms) for at least 48 hours prior to the first CHG shower.  You may shave your face.  Please follow these instructions carefully:   1.  Shower with CHG Soap the night before surgery and the    morning of Surgery.  2.  If you choose to wash your hair, wash your hair first as usual with your  normal shampoo.  3.  After you shampoo, rinse your hair and body thoroughly to remove the  Shampoo.  4.  Use CHG as you would any other liquid soap.  You can apply chg directly to the skin and wash gently with scrungie or a clean washcloth.  5.  Apply the CHG Soap to your body ONLY FROM THE NECK DOWN.     Do not use on open wounds or open sores.  Avoid contact with your eyes, ears, mouth and genitals (private parts).  Wash genitals (private parts) with your normal soap.  6.  Wash thoroughly, paying special attention to the area where your surgery  will be performed.  7.  Thoroughly rinse your body with warm water from the neck down.  8.  DO NOT shower/wash with your normal soap after using and rinsing off the CHG Soap.  9.  Pat yourself dry with a clean towel.  10.  Wear clean pajamas.            11.  Place clean sheets on your bed the night of your first shower and do not  sleep with pets.  Day of Surgery  Do not apply any lotions/deoderants the morning of surgery.  Please wear clean clothes to the hospital/surgery center.     Please read over the following fact sheets that you were given. Pain Booklet, Coughing and Deep Breathing, Blood Transfusion Information, MRSA Information and Surgical Site Infection Prevention

## 2015-04-18 NOTE — Progress Notes (Signed)
   04/18/15 1216  OBSTRUCTIVE SLEEP APNEA  Have you ever been diagnosed with sleep apnea through a sleep study? No  Do you snore loudly (loud enough to be heard through closed doors)?  1  Do you often feel tired, fatigued, or sleepy during the daytime? 0  Has anyone observed you stop breathing during your sleep? 0  Do you have, or are you being treated for high blood pressure? 1  BMI more than 35 kg/m2? 1  Age over 66 years old? 1  Neck circumference greater than 40 cm/16 inches? 1  Gender: 0

## 2015-04-19 LAB — HEMOGLOBIN A1C
HEMOGLOBIN A1C: 6.9 % — AB (ref 4.8–5.6)
Mean Plasma Glucose: 151 mg/dL

## 2015-04-28 MED ORDER — CEFAZOLIN SODIUM-DEXTROSE 2-3 GM-% IV SOLR
2.0000 g | INTRAVENOUS | Status: AC
  Administered 2015-04-29: 2 g via INTRAVENOUS

## 2015-04-28 MED ORDER — LACTATED RINGERS IV SOLN
INTRAVENOUS | Status: DC
  Administered 2015-04-29 (×3): via INTRAVENOUS

## 2015-04-28 NOTE — H&P (Signed)
TOTAL HIP ADMISSION H&P  Patient is admitted for left total hip arthroplasty.  Subjective:  Chief Complaint: left hip pain  HPI: Ashley Sims, 64 y.o. female, has a history of pain and functional disability in the left hip(s) due to arthritis and patient has failed non-surgical conservative treatments for greater than 12 weeks to include NSAID's and/or analgesics, corticosteriod injections, flexibility and strengthening excercises, use of assistive devices, weight reduction as appropriate and activity modification.  Onset of symptoms was gradual starting 6 years ago with gradually worsening course since that time.The patient noted no past surgery on the left hip(s).  Patient currently rates pain in the left hip at 9 out of 10 with activity. Patient has night pain, worsening of pain with activity and weight bearing, trendelenberg gait, pain that interfers with activities of daily living and pain with passive range of motion. Patient has evidence of subchondral sclerosis, joint subluxation and joint space narrowing by imaging studies. This condition presents safety issues increasing the risk of falls. This patient has had no.  There is no current active infection.  Patient Active Problem List   Diagnosis Date Noted  . DVT (deep venous thrombosis) 08/01/2014  . Type II or unspecified type diabetes mellitus without mention of complication, not stated as uncontrolled 07/25/2014  . Essential hypertension, benign 07/22/2014  . Depression 07/22/2014  . Hyperlipidemia 07/22/2014  . Insomnia 07/22/2014  . Rheumatoid arthritis 07/22/2014  . Type II or unspecified type diabetes mellitus without mention of complication, uncontrolled 07/22/2014  . Acute blood loss anemia 07/22/2014  . Chronic back pain 07/22/2014  . Degenerative joint disease (DJD) of hip 07/16/2014  . Diabetes 07/16/2014  . Morbid obesity 07/16/2014   Past Medical History  Diagnosis Date  . Insomnia     takes Elavil nightly  .  Hypertension     takes Lisinopril daily  . Depression     takes Zoloft daily  . Hyperlipidemia     takes Simvastatin daily  . Chronic back pain     [facility];takes Oxycontin and Percocet  . Joint pain   . Joint swelling   . GERD (gastroesophageal reflux disease)     takes Protonix daily  . History of colon polyps   . Stress incontinence   . Peripheral edema     left foot  . UTI (lower urinary tract infection)     was treated with Cipro 3wks ago  . Type II diabetes mellitus     takes Metformin daily  . DVT (deep venous thrombosis) 08/01/2014    RLE  . Migraine     "haven't had a migraine since 1970's"  . Rheumatoid arthritis     RA and osteo;takes Plaquenil daily and takes Methotrexate daily  . Osteoarthritis   . Uterine cancer     Past Surgical History  Procedure Laterality Date  . Cesarean section  1978  . Colonoscopy    . Total hip arthroplasty Right 07/16/2014    DR DALLDORF  . Total hip arthroplasty Right 07/16/2014    Procedure: TOTAL HIP ARTHROPLASTY ANTERIOR APPROACH;  Surgeon: Hessie Dibble, MD;  Location: [facility];  Service: Orthopedics;  Laterality: Right;  . Cholecystectomy  1990's  . Joint replacement    . Breast biopsy  1980's  . Abdominal hysterectomy  01/2012    "[facility]"  . Tubal ligation  1978  . Eye surgery Bilateral     laser to both eyes    No prescriptions prior to admission  Allergies  Allergen Reactions  . Bee Venom     blisters  . Tramadol     Sick on the stomach    History  Substance Use Topics  . Smoking status: Former Smoker -- 0.50 packs/day for 28 years    Types: Cigarettes    Quit date: 08/02/1999  . Smokeless tobacco: Never Used  . Alcohol Use: No    No family history on file.   Review of Systems  Cardiovascular:       History of right leg proximal DVT in 2015  All other systems reviewed and are negative.   Objective:  Physical Exam  Constitutional: She appears well-nourished.  HENT:   Head: Atraumatic.  Neck: Neck supple.  Cardiovascular: Normal rate.   Respiratory: Effort normal.  GI: Soft.  Musculoskeletal:  Left hip is very limited rotation.  Significant pain with rotation leg lengths roughly equal  Neurological: She is alert.  Skin: Skin is warm.  Psychiatric: She has a normal mood and affect.    Vital signs in last 24 hours:    Labs:   Estimated body mass index is 39.96 kg/(m^2) as calculated from the following:   Height as of 12/23/14: 5\' 4"  (1.626 m).   Weight as of 12/23/14: 105.643 kg (232 lb 14.4 oz).   Imaging Review Plain radiographs demonstrate severe degenerative joint disease of the left hip(s). The bone quality appears to be good for age and reported activity level.  Assessment/Plan:  PrimaryEnd stage arthritis, left hip(s)  The patient history, physical examination, clinical judgement of the provider and imaging studies are consistent with end stage degenerative joint disease of the left hip(s) and total hip arthroplasty is deemed medically necessary. The treatment options including medical management, injection therapy, arthroscopy and arthroplasty were discussed at length. The risks and benefits of total hip arthroplasty were presented and reviewed. The risks due to aseptic loosening, infection, stiffness, dislocation/subluxation,  thromboembolic complications and other imponderables were discussed.  The patient acknowledged the explanation, agreed to proceed with the plan and consent was signed. Patient is being admitted for inpatient treatment for surgery, pain control, PT, OT, prophylactic antibiotics, VTE prophylaxis, progressive ambulation and ADL's and discharge planning.The patient is planning to be discharged to skilled nursing facility. XareltoTo be restarted the night of surgery.

## 2015-04-29 ENCOUNTER — Inpatient Hospital Stay (HOSPITAL_COMMUNITY): Payer: Medicare Other | Admitting: Anesthesiology

## 2015-04-29 ENCOUNTER — Encounter (HOSPITAL_COMMUNITY): Payer: Self-pay | Admitting: *Deleted

## 2015-04-29 ENCOUNTER — Inpatient Hospital Stay (HOSPITAL_COMMUNITY)
Admission: RE | Admit: 2015-04-29 | Discharge: 2015-05-01 | DRG: 470 | Disposition: A | Discharge status: Skilled Nursing Facility | Payer: Medicare Other | Source: Ambulatory Visit | Attending: Orthopaedic Surgery | Admitting: Orthopaedic Surgery

## 2015-04-29 ENCOUNTER — Encounter (HOSPITAL_COMMUNITY)
Admission: RE | Disposition: A | Discharge status: Skilled Nursing Facility | Payer: Self-pay | Source: Ambulatory Visit | Attending: Orthopaedic Surgery

## 2015-04-29 ENCOUNTER — Inpatient Hospital Stay (HOSPITAL_COMMUNITY): Payer: Medicare Other

## 2015-04-29 DIAGNOSIS — Z96641 Presence of right artificial hip joint: Secondary | ICD-10-CM | POA: Diagnosis present

## 2015-04-29 DIAGNOSIS — M1612 Unilateral primary osteoarthritis, left hip: Principal | ICD-10-CM | POA: Diagnosis present

## 2015-04-29 DIAGNOSIS — Z8542 Personal history of malignant neoplasm of other parts of uterus: Secondary | ICD-10-CM

## 2015-04-29 DIAGNOSIS — B354 Tinea corporis: Secondary | ICD-10-CM | POA: Diagnosis present

## 2015-04-29 DIAGNOSIS — M549 Dorsalgia, unspecified: Secondary | ICD-10-CM | POA: Diagnosis present

## 2015-04-29 DIAGNOSIS — Z86718 Personal history of other venous thrombosis and embolism: Secondary | ICD-10-CM

## 2015-04-29 DIAGNOSIS — Z6839 Body mass index (BMI) 39.0-39.9, adult: Secondary | ICD-10-CM | POA: Diagnosis not present

## 2015-04-29 DIAGNOSIS — Z91030 Bee allergy status: Secondary | ICD-10-CM | POA: Diagnosis not present

## 2015-04-29 DIAGNOSIS — Z888 Allergy status to other drugs, medicaments and biological substances status: Secondary | ICD-10-CM | POA: Diagnosis not present

## 2015-04-29 DIAGNOSIS — G8929 Other chronic pain: Secondary | ICD-10-CM | POA: Diagnosis present

## 2015-04-29 DIAGNOSIS — F329 Major depressive disorder, single episode, unspecified: Secondary | ICD-10-CM | POA: Diagnosis present

## 2015-04-29 DIAGNOSIS — E119 Type 2 diabetes mellitus without complications: Secondary | ICD-10-CM | POA: Diagnosis present

## 2015-04-29 DIAGNOSIS — I10 Essential (primary) hypertension: Secondary | ICD-10-CM | POA: Diagnosis present

## 2015-04-29 DIAGNOSIS — K219 Gastro-esophageal reflux disease without esophagitis: Secondary | ICD-10-CM | POA: Diagnosis present

## 2015-04-29 DIAGNOSIS — Z419 Encounter for procedure for purposes other than remedying health state, unspecified: Secondary | ICD-10-CM

## 2015-04-29 DIAGNOSIS — Z87891 Personal history of nicotine dependence: Secondary | ICD-10-CM | POA: Diagnosis not present

## 2015-04-29 DIAGNOSIS — G4700 Insomnia, unspecified: Secondary | ICD-10-CM | POA: Diagnosis present

## 2015-04-29 DIAGNOSIS — M069 Rheumatoid arthritis, unspecified: Secondary | ICD-10-CM | POA: Diagnosis present

## 2015-04-29 DIAGNOSIS — M25552 Pain in left hip: Secondary | ICD-10-CM | POA: Diagnosis present

## 2015-04-29 DIAGNOSIS — E785 Hyperlipidemia, unspecified: Secondary | ICD-10-CM | POA: Diagnosis present

## 2015-04-29 HISTORY — PX: TOTAL HIP ARTHROPLASTY: SHX124

## 2015-04-29 LAB — POCT I-STAT 4, (NA,K, GLUC, HGB,HCT)
Glucose, Bld: 177 mg/dL — ABNORMAL HIGH (ref 65–99)
HCT: 33 % — ABNORMAL LOW (ref 36.0–46.0)
Hemoglobin: 11.2 g/dL — ABNORMAL LOW (ref 12.0–15.0)
Potassium: 4 mmol/L (ref 3.5–5.1)
Sodium: 136 mmol/L (ref 135–145)

## 2015-04-29 LAB — GLUCOSE, CAPILLARY
GLUCOSE-CAPILLARY: 131 mg/dL — AB (ref 65–99)
GLUCOSE-CAPILLARY: 198 mg/dL — AB (ref 65–99)
Glucose-Capillary: 244 mg/dL — ABNORMAL HIGH (ref 65–99)
Glucose-Capillary: 220 mg/dL — ABNORMAL HIGH (ref 65–99)
Glucose-Capillary: 195 mg/dL — ABNORMAL HIGH (ref 65–99)

## 2015-04-29 SURGERY — ARTHROPLASTY, HIP, TOTAL, ANTERIOR APPROACH
Anesthesia: General | Site: Hip | Laterality: Left

## 2015-04-29 MED ORDER — HYDROMORPHONE HCL 1 MG/ML IJ SOLN
0.2500 mg | INTRAMUSCULAR | Status: DC | PRN
  Administered 2015-04-29: 0.5 mg via INTRAVENOUS
  Administered 2015-04-29: 0.25 mg via INTRAVENOUS

## 2015-04-29 MED ORDER — OXYCODONE-ACETAMINOPHEN 7.5-325 MG PO TABS: 1.0000 | ORAL_TABLET | ORAL | Status: DC | PRN

## 2015-04-29 MED ORDER — PROPOFOL 10 MG/ML IV BOLUS
INTRAVENOUS | Status: DC | PRN
  Administered 2015-04-29 (×2): 30 mg via INTRAVENOUS
  Administered 2015-04-29: 150 mg via INTRAVENOUS
  Administered 2015-04-29: 20 mg via INTRAVENOUS

## 2015-04-29 MED ORDER — GABAPENTIN 300 MG PO CAPS
300.0000 mg | ORAL_CAPSULE | Freq: Two times a day (BID) | ORAL | Status: DC
  Administered 2015-04-29 – 2015-05-01 (×4): 300 mg via ORAL
  Filled 2015-04-29 (×4): qty 1

## 2015-04-29 MED ORDER — FENTANYL CITRATE (PF) 250 MCG/5ML IJ SOLN
INTRAMUSCULAR | Status: AC
  Filled 2015-04-29: qty 5

## 2015-04-29 MED ORDER — ACETAMINOPHEN 325 MG PO TABS: 650.0000 mg | ORAL_TABLET | Freq: Four times a day (QID) | ORAL | Status: DC | PRN

## 2015-04-29 MED ORDER — OXYBUTYNIN CHLORIDE ER 10 MG PO TB24
10.0000 mg | ORAL_TABLET | Freq: Every day | ORAL | Status: DC
  Administered 2015-04-29 – 2015-04-30 (×2): 10 mg via ORAL
  Filled 2015-04-29 (×2): qty 1

## 2015-04-29 MED ORDER — OXYCODONE HCL 5 MG PO TABS: 2.5000 mg | ORAL_TABLET | ORAL | Status: DC | PRN

## 2015-04-29 MED ORDER — GLYCOPYRROLATE 0.2 MG/ML IJ SOLN
INTRAMUSCULAR | Status: AC
  Filled 2015-04-29: qty 4

## 2015-04-29 MED ORDER — MIDAZOLAM HCL 5 MG/5ML IJ SOLN
INTRAMUSCULAR | Status: DC | PRN
  Administered 2015-04-29: 0.5 mg via INTRAVENOUS
  Administered 2015-04-29: 1 mg via INTRAVENOUS
  Administered 2015-04-29: 0.5 mg via INTRAVENOUS

## 2015-04-29 MED ORDER — PROPOFOL 10 MG/ML IV BOLUS
INTRAVENOUS | Status: AC
  Filled 2015-04-29: qty 20

## 2015-04-29 MED ORDER — PANTOPRAZOLE SODIUM 40 MG PO TBEC
40.0000 mg | DELAYED_RELEASE_TABLET | Freq: Every day | ORAL | Status: DC
  Administered 2015-04-30 – 2015-05-01 (×2): 40 mg via ORAL
  Filled 2015-04-29 (×2): qty 1

## 2015-04-29 MED ORDER — PHENYLEPHRINE HCL 10 MG/ML IJ SOLN
10.0000 mg | INTRAVENOUS | Status: DC | PRN
  Administered 2015-04-29: 40 ug/min via INTRAVENOUS

## 2015-04-29 MED ORDER — TRANEXAMIC ACID 1000 MG/10ML IV SOLN
2000.0000 mg | INTRAVENOUS | Status: DC | PRN
  Administered 2015-04-29: 2000 mg via TOPICAL

## 2015-04-29 MED ORDER — METOCLOPRAMIDE HCL 5 MG PO TABS: 5.0000 mg | ORAL_TABLET | Freq: Three times a day (TID) | ORAL | Status: DC | PRN

## 2015-04-29 MED ORDER — LIDOCAINE HCL (CARDIAC) 20 MG/ML IV SOLN
INTRAVENOUS | Status: AC
  Filled 2015-04-29: qty 5

## 2015-04-29 MED ORDER — LISINOPRIL 10 MG PO TABS
10.0000 mg | ORAL_TABLET | Freq: Every day | ORAL | Status: DC
  Administered 2015-04-29 – 2015-05-01 (×3): 10 mg via ORAL
  Filled 2015-04-29 (×3): qty 1

## 2015-04-29 MED ORDER — ACETAMINOPHEN 650 MG RE SUPP: 650.0000 mg | Freq: Four times a day (QID) | RECTAL | Status: DC | PRN

## 2015-04-29 MED ORDER — HYDROMORPHONE HCL 1 MG/ML IJ SOLN
0.5000 mg | INTRAMUSCULAR | Status: DC | PRN
  Administered 2015-04-29: 1 mg via INTRAVENOUS
  Filled 2015-04-29: qty 1

## 2015-04-29 MED ORDER — 0.9 % SODIUM CHLORIDE (POUR BTL) OPTIME
TOPICAL | Status: DC | PRN
  Administered 2015-04-29: 1000 mL

## 2015-04-29 MED ORDER — LACTATED RINGERS IV SOLN
INTRAVENOUS | Status: DC
  Administered 2015-04-30: via INTRAVENOUS

## 2015-04-29 MED ORDER — NEOSTIGMINE METHYLSULFATE 10 MG/10ML IV SOLN
INTRAVENOUS | Status: AC
  Filled 2015-04-29: qty 1

## 2015-04-29 MED ORDER — HYDROMORPHONE HCL 1 MG/ML IJ SOLN
INTRAMUSCULAR | Status: AC
  Filled 2015-04-29: qty 2

## 2015-04-29 MED ORDER — SIMVASTATIN 40 MG PO TABS
40.0000 mg | ORAL_TABLET | Freq: Every day | ORAL | Status: DC
  Administered 2015-04-29 – 2015-05-01 (×3): 40 mg via ORAL
  Filled 2015-04-29 (×3): qty 1

## 2015-04-29 MED ORDER — NEOSTIGMINE METHYLSULFATE 10 MG/10ML IV SOLN
INTRAVENOUS | Status: DC | PRN
  Administered 2015-04-29: 4 mg via INTRAVENOUS

## 2015-04-29 MED ORDER — TERBINAFINE HCL 1 % EX CREA
TOPICAL_CREAM | Freq: Every day | CUTANEOUS | Status: DC
  Administered 2015-04-30 – 2015-05-01 (×2): via TOPICAL
  Filled 2015-04-29: qty 12
  Filled 2015-04-29: qty 15

## 2015-04-29 MED ORDER — RIVAROXABAN 20 MG PO TABS
20.0000 mg | ORAL_TABLET | Freq: Every day | ORAL | Status: DC
  Administered 2015-04-30: 20 mg via ORAL
  Filled 2015-04-29: qty 1

## 2015-04-29 MED ORDER — DOCUSATE SODIUM 100 MG PO CAPS
100.0000 mg | ORAL_CAPSULE | Freq: Two times a day (BID) | ORAL | Status: DC
  Administered 2015-04-29 – 2015-05-01 (×4): 100 mg via ORAL
  Filled 2015-04-29 (×4): qty 1

## 2015-04-29 MED ORDER — BISACODYL 5 MG PO TBEC
5.0000 mg | DELAYED_RELEASE_TABLET | Freq: Every day | ORAL | Status: DC | PRN
  Administered 2015-04-30: 5 mg via ORAL
  Filled 2015-04-29: qty 1

## 2015-04-29 MED ORDER — SUCCINYLCHOLINE CHLORIDE 20 MG/ML IJ SOLN
INTRAMUSCULAR | Status: AC
  Filled 2015-04-29: qty 1

## 2015-04-29 MED ORDER — FENTANYL CITRATE (PF) 100 MCG/2ML IJ SOLN
INTRAMUSCULAR | Status: DC | PRN
  Administered 2015-04-29: 50 ug via INTRAVENOUS
  Administered 2015-04-29 (×2): 100 ug via INTRAVENOUS
  Administered 2015-04-29: 75 ug via INTRAVENOUS
  Administered 2015-04-29: 25 ug via INTRAVENOUS

## 2015-04-29 MED ORDER — CHLORHEXIDINE GLUCONATE 4 % EX LIQD: 60.0000 mL | Freq: Once | CUTANEOUS | Status: DC

## 2015-04-29 MED ORDER — ONDANSETRON HCL 4 MG/2ML IJ SOLN
4.0000 mg | Freq: Four times a day (QID) | INTRAMUSCULAR | Status: DC | PRN
Start: 2015-04-29 — End: 2015-05-01

## 2015-04-29 MED ORDER — SERTRALINE HCL 50 MG PO TABS
50.0000 mg | ORAL_TABLET | Freq: Every day | ORAL | Status: DC
  Administered 2015-04-30 – 2015-05-01 (×2): 50 mg via ORAL
  Filled 2015-04-29 (×2): qty 1

## 2015-04-29 MED ORDER — ARTIFICIAL TEARS OP OINT
TOPICAL_OINTMENT | OPHTHALMIC | Status: AC
  Filled 2015-04-29: qty 3.5

## 2015-04-29 MED ORDER — OXYCODONE HCL ER 10 MG PO T12A
20.0000 mg | EXTENDED_RELEASE_TABLET | Freq: Two times a day (BID) | ORAL | Status: DC
  Administered 2015-04-29 – 2015-05-01 (×4): 20 mg via ORAL
  Filled 2015-04-29 (×4): qty 2

## 2015-04-29 MED ORDER — SODIUM CHLORIDE 0.9 % IJ SOLN
INTRAMUSCULAR | Status: AC
  Filled 2015-04-29: qty 10

## 2015-04-29 MED ORDER — FUROSEMIDE 20 MG PO TABS
20.0000 mg | ORAL_TABLET | Freq: Every day | ORAL | Status: DC
  Administered 2015-04-30 – 2015-05-01 (×2): 20 mg via ORAL
  Filled 2015-04-29 (×2): qty 1

## 2015-04-29 MED ORDER — DIPHENHYDRAMINE HCL 12.5 MG/5ML PO ELIX: 12.5000 mg | ORAL_SOLUTION | ORAL | Status: DC | PRN

## 2015-04-29 MED ORDER — DEXAMETHASONE SODIUM PHOSPHATE 4 MG/ML IJ SOLN
INTRAMUSCULAR | Status: DC | PRN
  Administered 2015-04-29: 8 mg via INTRAVENOUS

## 2015-04-29 MED ORDER — PHENOL 1.4 % MT LIQD: 1.0000 | OROMUCOSAL | Status: DC | PRN

## 2015-04-29 MED ORDER — SUCCINYLCHOLINE CHLORIDE 20 MG/ML IJ SOLN
INTRAMUSCULAR | Status: DC | PRN
  Administered 2015-04-29: 110 mg via INTRAVENOUS

## 2015-04-29 MED ORDER — ONDANSETRON HCL 4 MG PO TABS: 4.0000 mg | ORAL_TABLET | Freq: Four times a day (QID) | ORAL | Status: DC | PRN

## 2015-04-29 MED ORDER — DEXTROSE 5 % IV SOLN
INTRAVENOUS | Status: DC | PRN
  Administered 2015-04-29: 08:00:00 via INTRAVENOUS

## 2015-04-29 MED ORDER — DEXAMETHASONE SODIUM PHOSPHATE 4 MG/ML IJ SOLN
INTRAMUSCULAR | Status: AC
  Filled 2015-04-29: qty 2

## 2015-04-29 MED ORDER — ALUM & MAG HYDROXIDE-SIMETH 200-200-20 MG/5ML PO SUSP: 30.0000 mL | ORAL | Status: DC | PRN

## 2015-04-29 MED ORDER — GLYCOPYRROLATE 0.2 MG/ML IJ SOLN
INTRAMUSCULAR | Status: DC | PRN
  Administered 2015-04-29: 0.6 mg via INTRAVENOUS

## 2015-04-29 MED ORDER — METFORMIN HCL 500 MG PO TABS
500.0000 mg | ORAL_TABLET | Freq: Two times a day (BID) | ORAL | Status: DC
  Administered 2015-04-29 – 2015-05-01 (×3): 500 mg via ORAL
  Filled 2015-04-29 (×3): qty 1

## 2015-04-29 MED ORDER — ONDANSETRON HCL 4 MG/2ML IJ SOLN
INTRAMUSCULAR | Status: AC
  Filled 2015-04-29: qty 2

## 2015-04-29 MED ORDER — MIDAZOLAM HCL 2 MG/2ML IJ SOLN
INTRAMUSCULAR | Status: AC
  Filled 2015-04-29: qty 2

## 2015-04-29 MED ORDER — RIVAROXABAN 20 MG PO TABS: 20.0000 mg | ORAL_TABLET | Freq: Every day | ORAL | Status: DC

## 2015-04-29 MED ORDER — ROCURONIUM BROMIDE 50 MG/5ML IV SOLN
INTRAVENOUS | Status: AC
  Filled 2015-04-29: qty 1

## 2015-04-29 MED ORDER — LIDOCAINE HCL (CARDIAC) 20 MG/ML IV SOLN
INTRAVENOUS | Status: DC | PRN
  Administered 2015-04-29: 80 mg via INTRAVENOUS

## 2015-04-29 MED ORDER — INSULIN ASPART 100 UNIT/ML ~~LOC~~ SOLN
0.0000 [IU] | Freq: Three times a day (TID) | SUBCUTANEOUS | Status: DC
  Administered 2015-04-29 (×2): 7 [IU] via SUBCUTANEOUS
  Administered 2015-04-30 – 2015-05-01 (×4): 4 [IU] via SUBCUTANEOUS

## 2015-04-29 MED ORDER — METOCLOPRAMIDE HCL 5 MG/ML IJ SOLN: 5.0000 mg | Freq: Three times a day (TID) | INTRAMUSCULAR | Status: DC | PRN

## 2015-04-29 MED ORDER — DULOXETINE HCL 60 MG PO CPEP
60.0000 mg | ORAL_CAPSULE | Freq: Every day | ORAL | Status: DC
  Administered 2015-04-30 – 2015-05-01 (×2): 60 mg via ORAL
  Filled 2015-04-29 (×2): qty 1

## 2015-04-29 MED ORDER — METHOCARBAMOL 500 MG PO TABS
500.0000 mg | ORAL_TABLET | Freq: Four times a day (QID) | ORAL | Status: DC | PRN
  Administered 2015-04-29 – 2015-04-30 (×2): 500 mg via ORAL
  Filled 2015-04-29: qty 1

## 2015-04-29 MED ORDER — MENTHOL 3 MG MT LOZG: 1.0000 | LOZENGE | OROMUCOSAL | Status: DC | PRN

## 2015-04-29 MED ORDER — METHOCARBAMOL 500 MG PO TABS
ORAL_TABLET | ORAL | Status: AC
  Filled 2015-04-29: qty 1

## 2015-04-29 MED ORDER — ONDANSETRON HCL 4 MG/2ML IJ SOLN
INTRAMUSCULAR | Status: DC | PRN
  Administered 2015-04-29: 4 mg via INTRAVENOUS

## 2015-04-29 MED ORDER — HYDRALAZINE HCL 20 MG/ML IJ SOLN
INTRAMUSCULAR | Status: DC | PRN
  Administered 2015-04-29 (×3): 2 mg via INTRAVENOUS

## 2015-04-29 MED ORDER — OXYCODONE-ACETAMINOPHEN 5-325 MG PO TABS
1.0000 | ORAL_TABLET | ORAL | Status: DC | PRN
  Administered 2015-04-29 – 2015-05-01 (×10): 2 via ORAL
  Filled 2015-04-29 (×10): qty 2

## 2015-04-29 MED ORDER — ROCURONIUM BROMIDE 100 MG/10ML IV SOLN
INTRAVENOUS | Status: DC | PRN
  Administered 2015-04-29: 40 mg via INTRAVENOUS
  Administered 2015-04-29: 10 mg via INTRAVENOUS

## 2015-04-29 MED ORDER — FERROUS SULFATE 325 (65 FE) MG PO TABS
325.0000 mg | ORAL_TABLET | Freq: Two times a day (BID) | ORAL | Status: DC
  Administered 2015-04-29 – 2015-05-01 (×4): 325 mg via ORAL
  Filled 2015-04-29 (×4): qty 1

## 2015-04-29 MED ORDER — ALBUMIN HUMAN 5 % IV SOLN
INTRAVENOUS | Status: DC | PRN
  Administered 2015-04-29: 09:00:00 via INTRAVENOUS

## 2015-04-29 MED ORDER — CEFAZOLIN SODIUM-DEXTROSE 2-3 GM-% IV SOLR
2.0000 g | Freq: Four times a day (QID) | INTRAVENOUS | Status: AC
Start: 2015-04-29 — End: 2015-04-30
  Administered 2015-04-29 – 2015-04-30 (×2): 2 g via INTRAVENOUS
  Filled 2015-04-29 (×2): qty 50

## 2015-04-29 MED ORDER — TRANEXAMIC ACID 1000 MG/10ML IV SOLN
2000.0000 mg | INTRAVENOUS | Status: DC
  Filled 2015-04-29: qty 20

## 2015-04-29 MED ORDER — METHOCARBAMOL 1000 MG/10ML IJ SOLN
500.0000 mg | Freq: Four times a day (QID) | INTRAVENOUS | Status: DC | PRN
  Filled 2015-04-29: qty 5

## 2015-04-29 MED ORDER — PROMETHAZINE HCL 25 MG/ML IJ SOLN
6.2500 mg | INTRAMUSCULAR | Status: DC | PRN
Start: 2015-04-29 — End: 2015-04-29

## 2015-04-29 SURGICAL SUPPLY — 52 items
BAG DECANTER FOR FLEXI CONT (MISCELLANEOUS) ×3 IMPLANT
BLADE SAW SGTL 18X1.27X75 (BLADE) ×2 IMPLANT
BLADE SAW SGTL 18X1.27X75MM (BLADE) ×1
BLADE SURG ROTATE 9660 (MISCELLANEOUS) IMPLANT
CAPT HIP TOTAL 2 ×3 IMPLANT
CELLS DAT CNTRL 66122 CELL SVR (MISCELLANEOUS) ×1 IMPLANT
COVER PERINEAL POST (MISCELLANEOUS) ×3 IMPLANT
COVER SURGICAL LIGHT HANDLE (MISCELLANEOUS) ×3 IMPLANT
DRAPE C-ARM 42X72 X-RAY (DRAPES) ×3 IMPLANT
DRAPE IMP U-DRAPE 54X76 (DRAPES) ×3 IMPLANT
DRAPE STERI IOBAN 125X83 (DRAPES) ×3 IMPLANT
DRAPE U-SHAPE 47X51 STRL (DRAPES) ×9 IMPLANT
DRSG AQUACEL AG ADV 3.5X10 (GAUZE/BANDAGES/DRESSINGS) ×3 IMPLANT
DURAPREP 26ML APPLICATOR (WOUND CARE) ×6 IMPLANT
ELECT BLADE 4.0 EZ CLEAN MEGAD (MISCELLANEOUS)
ELECT CAUTERY BLADE 6.4 (BLADE) ×3 IMPLANT
ELECT REM PT RETURN 9FT ADLT (ELECTROSURGICAL) ×3
ELECTRODE BLDE 4.0 EZ CLN MEGD (MISCELLANEOUS) IMPLANT
ELECTRODE REM PT RTRN 9FT ADLT (ELECTROSURGICAL) ×1 IMPLANT
FACESHIELD WRAPAROUND (MASK) ×9 IMPLANT
GLOVE BIO SURGEON STRL SZ8 (GLOVE) ×15 IMPLANT
GLOVE BIOGEL PI IND STRL 8 (GLOVE) ×2 IMPLANT
GLOVE BIOGEL PI INDICATOR 8 (GLOVE) ×4
GOWN STRL REUS W/ TWL LRG LVL3 (GOWN DISPOSABLE) ×1 IMPLANT
GOWN STRL REUS W/ TWL XL LVL3 (GOWN DISPOSABLE) ×2 IMPLANT
GOWN STRL REUS W/TWL LRG LVL3 (GOWN DISPOSABLE) ×2
GOWN STRL REUS W/TWL XL LVL3 (GOWN DISPOSABLE) ×4
KIT BASIN OR (CUSTOM PROCEDURE TRAY) ×3 IMPLANT
KIT ROOM TURNOVER OR (KITS) ×3 IMPLANT
LINER BOOT UNIVERSAL DISP (MISCELLANEOUS) IMPLANT
MANIFOLD NEPTUNE II (INSTRUMENTS) ×3 IMPLANT
NS IRRIG 1000ML POUR BTL (IV SOLUTION) ×3 IMPLANT
PACK TOTAL JOINT (CUSTOM PROCEDURE TRAY) ×3 IMPLANT
PACK UNIVERSAL I (CUSTOM PROCEDURE TRAY) ×3 IMPLANT
PAD ARMBOARD 7.5X6 YLW CONV (MISCELLANEOUS) ×6 IMPLANT
RTRCTR WOUND ALEXIS 18CM MED (MISCELLANEOUS) ×3
STAPLER VISISTAT 35W (STAPLE) ×3 IMPLANT
SUT ETHIBOND NAB CT1 #1 30IN (SUTURE) ×6 IMPLANT
SUT VIC AB 0 CT1 27 (SUTURE)
SUT VIC AB 0 CT1 27XBRD ANBCTR (SUTURE) IMPLANT
SUT VIC AB 1 CT1 27 (SUTURE) ×2
SUT VIC AB 1 CT1 27XBRD ANBCTR (SUTURE) ×1 IMPLANT
SUT VIC AB 2-0 CT1 27 (SUTURE) ×4
SUT VIC AB 2-0 CT1 TAPERPNT 27 (SUTURE) ×2 IMPLANT
SUT VLOC 180 0 24IN GS25 (SUTURE) ×3 IMPLANT
TOWEL OR 17X24 6PK STRL BLUE (TOWEL DISPOSABLE) ×3 IMPLANT
TOWEL OR 17X26 10 PK STRL BLUE (TOWEL DISPOSABLE) ×6 IMPLANT
TRAY FOLEY CATH 14FR (SET/KITS/TRAYS/PACK) IMPLANT
TUBE CONNECTING 12'X1/4 (SUCTIONS) ×1
TUBE CONNECTING 12X1/4 (SUCTIONS) ×2 IMPLANT
WATER STERILE IRR 1000ML POUR (IV SOLUTION) IMPLANT
YANKAUER SUCT BULB TIP NO VENT (SUCTIONS) ×3 IMPLANT

## 2015-04-29 NOTE — Addendum Note (Signed)
Addendum  created 04/29/15 1424 by Terrill Mohr, CRNA   Modules edited: Anesthesia Attestations

## 2015-04-29 NOTE — Anesthesia Postprocedure Evaluation (Signed)
  Anesthesia Post-op Note  Patient: Ashley Sims  Procedure(s) Performed: Procedure(s): TOTAL HIP ARTHROPLASTY ANTERIOR APPROACH (Left)  Patient Location: PACU  Anesthesia Type:General  Level of Consciousness: awake and alert   Airway and Oxygen Therapy: Patient Spontanous Breathing  Post-op Pain: mild  Post-op Assessment: Post-op Vital signs reviewed and Patient's Cardiovascular Status Stable LLE Motor Response: Purposeful movement, Responds to commands LLE Sensation: Full sensation, No numbness, No tingling          Post-op Vital Signs: stable  Last Vitals:  Filed Vitals:   04/29/15 1045  BP: 140/52  Pulse: 95  Temp:   Resp: 22    Complications: No apparent anesthesia complications

## 2015-04-29 NOTE — Care Management (Signed)
Utilization review completed by Verlee Rossetti, RN BSN [phone]

## 2015-04-29 NOTE — Interval H&P Note (Signed)
OK for surgery PD 

## 2015-04-29 NOTE — Anesthesia Preprocedure Evaluation (Addendum)
Anesthesia Evaluation  Patient identified by MRN, date of birth, ID band Patient awake    Reviewed: Allergy & Precautions, NPO status , Patient's Chart, lab work & pertinent test results, reviewed documented beta blocker date and time   Airway Mallampati: III  TM Distance: <3 FB Neck ROM: Full  Mouth opening: Limited Mouth Opening  Dental  (+) Teeth Intact, Dental Advisory Given   Pulmonary former smoker,  breath sounds clear to auscultation        Cardiovascular hypertension, Pt. on medications Rhythm:Regular Rate:Normal     Neuro/Psych    GI/Hepatic GERD-  Medicated and Controlled,  Endo/Other  diabetes, Well Controlled, Type 2  Renal/GU      Musculoskeletal   Abdominal (+) + obese,   Peds  Hematology   Anesthesia Other Findings   Reproductive/Obstetrics                           Anesthesia Physical Anesthesia Plan  ASA: III  Anesthesia Plan: General   Post-op Pain Management:    Induction: Intravenous  Airway Management Planned: Oral ETT  Additional Equipment:   Intra-op Plan:   Post-operative Plan: Extubation in OR  Informed Consent: I have reviewed the patients History and Physical, chart, labs and discussed the procedure including the risks, benefits and alternatives for the proposed anesthesia with the patient or authorized representative who has indicated his/her understanding and acceptance.   Dental advisory given  Plan Discussed with: CRNA and Surgeon  Anesthesia Plan Comments:         Anesthesia Quick Evaluation

## 2015-04-29 NOTE — Evaluation (Signed)
Physical Therapy Evaluation Patient Details Name: Ashley Sims MRN: [id number] DOB: 1951/01/28 Today's Date: 04/29/2015   History of Present Illness  Pt is a 64 y/o F s/p L THA.  Pt's PMH includes DVT, DM, HTN, depression, RA, chronic back pain, morbid obesity, peripheral edema, UTI, R THA.   Clinical Impression  Pt is s/p L THA resulting in the deficits listed below (see PT Problem List). Pt limited to ambulating 10 ft in room using RW 2/2 severe pain in L hip.  Pt will benefit from skilled PT to increase their independence and safety with mobility to allow discharge to the venue listed below. Recommending SNF upon d/c as pt does not have 24/7 physical assist available at home but relies on assist at baseline for ADLs (bathing and assist w/ RW).      Follow Up Recommendations SNF;Supervision for mobility/OOB    Equipment Recommendations  None recommended by PT    Recommendations for Other Services       Precautions / Restrictions Precautions Precautions: Fall;Other (comment) (Direct anterior: no hip precautions) Restrictions Weight Bearing Restrictions: Yes LLE Weight Bearing: Weight bearing as tolerated      Mobility  Bed Mobility Overal bed mobility: Needs Assistance Bed Mobility: Supine to Sit     Supine to sit: HOB elevated;Mod assist     General bed mobility comments: Mod assist w/ use of bed pad and managing LLE.  Cues for hand placement and technique, pt used bed rails.  Transfers Overall transfer level: Needs assistance Equipment used: Rolling walker (2 wheeled) Transfers: Sit to/from Stand Sit to Stand: Min assist         General transfer comment: Min assist to power up to standing.  Pt incontinent and pericare provided once pt standing.    Ambulation/Gait Ambulation/Gait assistance: Min guard Ambulation Distance (Feet): 10 Feet Assistive device: Rolling walker (2 wheeled) Gait Pattern/deviations: Step-to pattern;Shuffle;Antalgic;Decreased stride  length;Decreased stance time - left;Decreased weight shift to left;Trunk flexed   Gait velocity interpretation: Below normal speed for age/gender General Gait Details: Trunk flexion which pt reports is her baseline 2/2 h/o hip and back pain, Inc WB through BUEs and dec WB through LLE.    Stairs            Wheelchair Mobility    Modified Rankin (Stroke Patients Only)       Balance Overall balance assessment: Needs assistance Sitting-balance support: Bilateral upper extremity supported;Feet supported Sitting balance-Leahy Scale: Fair     Standing balance support: Bilateral upper extremity supported;During functional activity Standing balance-Leahy Scale: Poor                               Pertinent Vitals/Pain Pain Assessment: 0-10 Pain Score: 8  Pain Location: L hip Pain Descriptors / Indicators: Aching;Constant;Grimacing;Guarding;Heaviness Pain Intervention(s): Limited activity within patient's tolerance;Monitored during session;Repositioned;Patient requesting pain meds-RN notified    Home Living Family/patient expects to be discharged to:: Skilled nursing facility Living Arrangements: Spouse/significant other Available Help at Discharge: Family;Available PRN/intermittently (Sister prn; husband unable to provide physical assist) Type of Home: House (Townhouse) Home Access: Level entry     Home Layout: One level Home Equipment: Environmental consultant - 4 wheels;Bedside commode;Walker - 2 wheels;Shower seat - built in;Adaptive equipment;Grab bars - toilet;Grab bars - tub/shower (bed rail)      Prior Function Level of Independence: Needs assistance   Gait / Transfers Assistance Needed: amb w/ rollator w/ assist from husband holding it in  place when necessary  ADL's / Homemaking Assistance Needed: assist w/ transfer to bathtub from husband        Hand Dominance   Dominant Hand: Right    Extremity/Trunk Assessment               Lower Extremity Assessment:  Generalized weakness;LLE deficits/detail   LLE Deficits / Details: weakness and limited ROM as expected s/p L THA     Communication   Communication: No difficulties  Cognition Arousal/Alertness: Lethargic Behavior During Therapy: Anxious Overall Cognitive Status: Within Functional Limits for tasks assessed                      General Comments      Exercises Total Joint Exercises Ankle Circles/Pumps: AROM;Both;15 reps;Supine Quad Sets: AROM;Both;5 reps;Supine Heel Slides: AAROM;Left;5 reps;Supine      Assessment/Plan    PT Assessment Patient needs continued PT services  PT Diagnosis Difficulty walking;Abnormality of gait;Generalized weakness;Acute pain   PT Problem List Decreased strength;Decreased range of motion;Decreased activity tolerance;Decreased balance;Decreased mobility;Decreased coordination;Decreased knowledge of use of DME;Decreased safety awareness;Decreased knowledge of precautions;Obesity;Decreased skin integrity;Pain  PT Treatment Interventions DME instruction;Gait training;Stair training;Functional mobility training;Therapeutic activities;Balance training;Therapeutic exercise;Neuromuscular re-education;Patient/family education;Modalities   PT Goals (Current goals can be found in the Care Plan section) Acute Rehab PT Goals Patient Stated Goal: to go to rehab then home PT Goal Formulation: With patient/family Time For Goal Achievement: 05/06/15 Potential to Achieve Goals: Good    Frequency 7X/week   Barriers to discharge Decreased caregiver support Pt does not have 24/7 assist upon d/c    Co-evaluation               End of Session Equipment Utilized During Treatment: Gait belt Activity Tolerance: Patient limited by pain;Patient limited by fatigue Patient left: in chair;with call bell/phone within reach;with family/visitor present Nurse Communication: Mobility status;Precautions;Weight bearing status;Patient requests pain meds          Time: 3875-6433 PT Time Calculation (min) (ACUTE ONLY): 36 min   Charges:   PT Evaluation $Initial PT Evaluation Tier I: 1 Procedure PT Treatments $Therapeutic Exercise: 8-22 mins   PT G Codes:       Joslyn Hy PT, DPT [phone] Pager: [phone] 04/29/2015, 3:39 PM

## 2015-04-29 NOTE — Transfer of Care (Signed)
Immediate Anesthesia Transfer of Care Note  Patient: Ashley Sims  Procedure(s) Performed: Procedure(s): TOTAL HIP ARTHROPLASTY ANTERIOR APPROACH (Left)  Patient Location: PACU  Anesthesia Type:General  Level of Consciousness: awake and patient cooperative  Airway & Oxygen Therapy: Patient Spontanous Breathing and Patient connected to nasal cannula oxygen  Post-op Assessment: Report given to RN and Post -op Vital signs reviewed and stable  Post vital signs: Reviewed  Last Vitals:  Filed Vitals:   04/29/15 1000  BP:   Pulse: 97  Temp:   Resp: 17    Complications: No apparent anesthesia complications

## 2015-04-29 NOTE — Anesthesia Procedure Notes (Addendum)
Procedure Name: Intubation Date/Time: 04/29/2015 7:37 AM Performed by: Terrill Mohr Pre-anesthesia Checklist: Patient identified, Emergency Drugs available, Suction available and Patient being monitored Patient Re-evaluated:Patient Re-evaluated prior to inductionOxygen Delivery Method: Circle system utilized Preoxygenation: Pre-oxygenation with 100% oxygen Intubation Type: IV induction Ventilation: Mask ventilation with difficulty Laryngoscope Size: Mac and 3 Grade View: Grade II Tube type: Oral Tube size: 7.5 mm Number of attempts: 1 Airway Equipment and Method: Stylet Placement Confirmation: ETT inserted through vocal cords under direct vision,  breath sounds checked- equal and bilateral and positive ETCO2 Secured at: 21 (cm at teeth) cm Tube secured with: Tape Dental Injury: Teeth and Oropharynx as per pre-operative assessment  Comments: Would need an oral airway for effective mask ventilation

## 2015-04-29 NOTE — Op Note (Signed)
PRE-OP DIAGNOSIS:  LEFT HIP DEGENERATIVE JOINT DISEASE POST-OP DIAGNOSIS: same PROCEDURE:  LEFT TOTAL HIP ARTHROPLASTY ANTERIOR APPROACH ANESTHESIA:  General SURGEON:  Melrose Nakayama MD ASSISTANT:  Loni Dolly PA-C   INDICATIONS FOR PROCEDURE:  The patient is a 64 y.o. female with a long history of a painful hip.  This has persisted despite multiple conservative measures.  The patient has persisted with pain and dysfunction making rest and activity difficult.  A total hip replacement is offered as surgical treatment.  Informed operative consent was obtained after discussion of possible complications including reaction to anesthesia, infection, neurovascular injury, dislocation, DVT, PE, and death.  The importance of the postoperative rehab program to optimize result was stressed with the patient.  SUMMARY OF FINDINGS AND PROCEDURE:  Under general anesthesia through a anterior approach an the Hana table a left THR was performed.  The patient had severe degenerative change and good bone quality.  We used DePuy components to replace the hip and these were size 12 KLA Corail femur capped with a +5 66mm ceramic hip ball.  On the acetabular side we used a size 48 Gription shell with a plus 4 neutral polyethylene liner.  We did not use a hole eliminator.  Loni Dolly PA-C assisted throughout and was invaluable to the completion of the case in that he helped position and retract while I performed the procedure.  He also closed simultaneously to help minimize OR time.  I used fluoroscopy throughout the case to check position of components and leg lengths and read all these views myself.  DESCRIPTION OF PROCEDURE:  The patient was taken to the OR suite where general anesthetic was applied.  The patient was then positioned on the Hana table supine.  All bony prominences were appropriately padded.  Prep and drape was then performed in normal sterile fashion.  The patient was given kefzol preoperative antibiotic and  an appropriate time out was performed.  We then took an anterior approach to the left hip.  Dissection was taken through adipose to the tensor fascia lata fascia.  This structure was incised longitudinally and we dissected in the intermuscular interval just medial to this muscle.  Cobra retractors were placed superior and inferior to the femoral neck superficial to the capsule.  A capsular incision was then made and the retractors were placed along the femoral neck.  Xray was brought in to get a good level for the femoral neck cut which was made with an oscillating saw and osteotome.  The femoral head was removed with a corkscrew.  The acetabulum was exposed and some labral tissues were excised. Reaming was taken to the inside wall of the pelvis and sequentially up to 1 mm smaller than the actual component.  A trial of components was done and then the aforementioned acetabular shell was placed in appropriate tilt and anteversion confirmed by fluoroscopy. The liner was placed along with the hole eliminator and attention was turned to the femur.  The leg was brought down and over into adduction and the elevator bar was used to raise the femur up gently in the wound.  The piriformis was released with care taken to preserve the obturator internus attachment and all of the posterior capsule. The femur was reamed and then broached to the appropriate size.  A trial reduction was done and the aforementioned head and neck assembly gave [place] the best stability in extension with external rotation.  Leg lengths were felt to be about equal by fluoroscopic exam.  The trial components were removed and the wound irrigated.  We then placed the femoral component in appropriate anteversion.  The head was applied to a dry stem neck and the hip again reduced.  It was again stable in the aforementioned position.  The would was irrigated again followed by re-approximation of anterior capsule with ethibond suture. Tensor fascia was repaired  with V-loc suture  followed by subcutaneous closure with #O and #2 undyed vicryl.  Skin was closed with staples followed by a sterile dressing.  EBL and IOF can be obtained from anesthesia records.  DISPOSITION:  The patient was extubated in the OR and taken to PACU in stable condition to be admitted to the Orthopedic Surgery for appropriate post-op care to include perioperative antibiotics and DVT prophylaxis.

## 2015-04-30 ENCOUNTER — Encounter (HOSPITAL_COMMUNITY): Payer: Self-pay | Admitting: Orthopaedic Surgery

## 2015-04-30 LAB — GLUCOSE, CAPILLARY
GLUCOSE-CAPILLARY: 153 mg/dL — AB (ref 65–99)
Glucose-Capillary: 183 mg/dL — ABNORMAL HIGH (ref 65–99)
Glucose-Capillary: 194 mg/dL — ABNORMAL HIGH (ref 65–99)
Glucose-Capillary: 158 mg/dL — ABNORMAL HIGH (ref 65–99)

## 2015-04-30 LAB — CBC
HCT: 30.6 % — ABNORMAL LOW (ref 36.0–46.0)
Hemoglobin: 9.7 g/dL — ABNORMAL LOW (ref 12.0–15.0)
MCH: 27.8 pg (ref 26.0–34.0)
MCHC: 31.7 g/dL (ref 30.0–36.0)
MCV: 87.7 fL (ref 78.0–100.0)
PLATELETS: 265 10*3/uL (ref 150–400)
RBC: 3.49 MIL/uL — ABNORMAL LOW (ref 3.87–5.11)
RDW: 15.2 % (ref 11.5–15.5)
WBC: 10.1 10*3/uL (ref 4.0–10.5)

## 2015-04-30 LAB — BASIC METABOLIC PANEL
Anion gap: 9 (ref 5–15)
BUN: 11 mg/dL (ref 6–20)
CALCIUM: 9 mg/dL (ref 8.9–10.3)
CO2: 28 mmol/L (ref 22–32)
Chloride: 100 mmol/L — ABNORMAL LOW (ref 101–111)
Creatinine, Ser: 0.8 mg/dL (ref 0.44–1.00)
GFR calc Af Amer: >60 mL/min (ref >60)
GFR calc non Af Amer: >60 mL/min (ref >60)
Glucose, Bld: 158 mg/dL — ABNORMAL HIGH (ref 65–99)
Potassium: 4.2 mmol/L (ref 3.5–5.1)
Sodium: 137 mmol/L (ref 135–145)

## 2015-04-30 NOTE — Plan of Care (Signed)
Problem: Consults Goal: Diagnosis- Total Joint Replacement Primary Total Hip     

## 2015-04-30 NOTE — Progress Notes (Signed)
Subjective: 1 Day Post-Op Procedure(s) (LRB): TOTAL HIP ARTHROPLASTY ANTERIOR APPROACH (Left)  Activity level:  wbat Diet tolerance:  ok Voiding:  ok Patient reports pain as mild and moderate.    Objective: Vital signs in last 24 hours: Temp:  [97.5 F (36.4 C)-98.6 F (37 C)] 98.6 F (37 C) (06/29 0541) Pulse Rate:  [71-101] 91 (06/29 0541) Resp:  [15-27] 16 (06/29 0541) BP: (101-148)/(41-61) 101/41 mmHg (06/29 0541) SpO2:  [94 %-100 %] 95 % (06/29 0541)  Labs:  Recent Labs  04/29/15 0908 04/30/15 0450  HGB 11.2* 9.7*    Recent Labs  04/29/15 0908 04/30/15 0450  WBC  --  10.1  RBC  --  3.49*  HCT 33.0* 30.6*  PLT  --  265    Recent Labs  04/29/15 0908 04/30/15 0450  NA 136 137  K 4.0 4.2  CL  --  100*  CO2  --  28  BUN  --  11  CREATININE  --  0.80  GLUCOSE 177* 158*  CALCIUM  --  9.0   No results for input(s): LABPT, INR in the last 72 hours.  Physical Exam:  Neurologically intact ABD soft Neurovascular intact Sensation intact distally Intact pulses distally Dorsiflexion/Plantar flexion intact Incision: dressing C/D/I and no drainage No cellulitis present Compartment soft  Assessment/Plan:  1 Day Post-Op Procedure(s) (LRB): TOTAL HIP ARTHROPLASTY ANTERIOR APPROACH (Left) Advance diet Up with therapy D/C IV fluids Plan for discharge tomorrow Discharge to SNF [facility] if doing well and cleared by PT. Continue on xarelto for dvt prevention. Follow up in office 2 weeks post op.     Jazlyne Gauger, Larwance Sachs 04/30/2015, 7:42 AM

## 2015-04-30 NOTE — Progress Notes (Signed)
Physical Therapy Treatment Patient Details Name: Ashley Sims MRN: [id number] DOB: 02-05-1951 Today's Date: 04/30/2015    History of Present Illness Pt is a 64 y/o F s/p L THA.  Pt's PMH includes DVT, DM, HTN, depression, RA, chronic back pain, morbid obesity, peripheral edema, UTI, R THA.     PT Comments    Pt  Making good progress. COn't to recommend SNF for rehab.  Follow Up Recommendations  SNF;Supervision for mobility/OOB     Equipment Recommendations  None recommended by PT    Recommendations for Other Services       Precautions / Restrictions Precautions Precautions: Fall;Other (comment) (direct anterior (not precautions)) Restrictions Weight Bearing Restrictions: Yes LLE Weight Bearing: Weight bearing as tolerated    Mobility  Bed Mobility               General bed mobility comments: up in recliner upon arrival  Transfers Overall transfer level: Needs assistance Equipment used: Rolling walker (2 wheeled) Transfers: Sit to/from Stand Sit to Stand: Min assist         General transfer comment: cueing for technique  Ambulation/Gait Ambulation/Gait assistance: Min guard Ambulation Distance (Feet): 32 Feet Assistive device: Rolling walker (2 wheeled) Gait Pattern/deviations: Step-to pattern;Antalgic Gait velocity: decreased   General Gait Details: Cues for RW placement due to patient either placing too close and then too far.  1 standing rest break.   Stairs            Wheelchair Mobility    Modified Rankin (Stroke Patients Only)       Balance Overall balance assessment: Needs assistance         Standing balance support: Bilateral upper extremity supported Standing balance-Leahy Scale: Poor Standing balance comment: rw for balance                    Cognition Arousal/Alertness: Awake/alert Behavior During Therapy: Anxious Overall Cognitive Status: Within Functional Limits for tasks assessed                       Exercises Total Joint Exercises Ankle Circles/Pumps: AROM;Both;15 reps;Supine Quad Sets: AROM;10 reps;Supine;Left Short Arc Quad: AROM;Strengthening;Left;10 reps;Supine Heel Slides: AAROM;Left;10 reps;Supine Hip ABduction/ADduction: AAROM;AROM;Left;10 reps;Supine    General Comments        Pertinent Vitals/Pain Pain Assessment: 0-10 Pain Score: 7  Pain Location: L hip Pain Descriptors / Indicators: Operative site guarding Pain Intervention(s): Monitored during session;Premedicated before session    Home Living Family/patient expects to be discharged to:: Skilled nursing facility Living Arrangements: Spouse/significant other Available Help at Discharge: Family;Available PRN/intermittently Type of Home: House Home Access: Level entry   Home Layout: One level Home Equipment: Walker - 4 wheels;Bedside commode;Walker - 2 wheels;Shower seat - built in;Adaptive equipment;Grab bars - toilet;Grab bars - tub/shower      Prior Function Level of Independence: Needs assistance  Gait / Transfers Assistance Needed: amb w/ rollator w/ assist from husband holding it in place when necessary ADL's / Homemaking Assistance Needed: min guard for walk-in shower transfer to shower seat, used grab bars for toilet/shower transfers; used rollator     PT Goals (current goals can now be found in the care plan section) Acute Rehab PT Goals Patient Stated Goal: to go to rehab then home PT Goal Formulation: With patient/family Time For Goal Achievement: 05/06/15 Potential to Achieve Goals: Good Progress towards PT goals: Progressing toward goals    Frequency  7X/week    PT Plan Current plan remains  appropriate    Co-evaluation             End of Session Equipment Utilized During Treatment: Gait belt Activity Tolerance: No increased pain;Patient limited by pain Patient left: in chair;with call bell/phone within reach;with family/visitor present     Time: 4627-0350 PT Time  Calculation (min) (ACUTE ONLY): 18 min  Charges:  $Gait Training: 8-22 mins                    G Codes:      Ashley Sims 04/30/2015, 11:59 AM

## 2015-04-30 NOTE — Progress Notes (Signed)
Occupational Therapy Evaluation Patient Details Name: Ashley Sims MRN: [id number] DOB: 1951/08/11 Today's Date: 04/30/2015    History of Present Illness Pt is a 64 y/o F s/p L THA.  Pt's PMH includes DVT, DM, HTN, depression, RA, chronic back pain, morbid obesity, peripheral edema, UTI, R THA.    Clinical Impression   Pt admitted with the above diagnoses and presents with below problem list. Pt will benefit from continued acute OT to address the below listed deficits and maximize independence with BADLs prior to d/c to venue below. PTA pt was mod I for most ADLs, min guard for shower transfers. Pt is currently mod A for LB ADLs and completes toilet transfers as SPT with assistance. Functional mobility not assessed this session due to pain with pt having just completed SPT to/from [facility]. OT to continue to follow acutely and recommend SNF at d/c.      Follow Up Recommendations  SNF    Equipment Recommendations  Other (comment) (TBD next venue)    Recommendations for Other Services       Precautions / Restrictions Precautions Precautions: Fall;Other (comment) (Direct anterior: no hip precautions) Restrictions Weight Bearing Restrictions: Yes LLE Weight Bearing: Weight bearing as tolerated      Mobility Bed Mobility               General bed mobility comments: not assessed  Transfers Overall transfer level: Needs assistance Equipment used: Rolling walker (2 wheeled) Transfers: Sit to/from Stand Sit to Stand: Min assist         General transfer comment: Min A to control descent in to recliner.     Balance Overall balance assessment: Needs assistance         Standing balance support: Bilateral upper extremity supported;During functional activity Standing balance-Leahy Scale: Poor Standing balance comment: rw for balance                            ADL Overall ADL's : Needs assistance/impaired Eating/Feeding: Set up;Sitting   Grooming: Set  up;Sitting   Upper Body Bathing: Set up;Sitting   Lower Body Bathing: Moderate assistance;Sit to/from stand;With adaptive equipment Lower Body Bathing Details (indicate cue type and reason): min A sit<>stand, physical assist for bathing perianal area in sit<>stand Upper Body Dressing : Set up;Sitting   Lower Body Dressing: Moderate assistance;With adaptive equipment;Sit to/from stand Lower Body Dressing Details (indicate cue type and reason): physical assist for balance and to complete donning LB dressing Toilet Transfer: Minimal assistance;Stand-pivot;BSC;RW   Toileting- Clothing Manipulation and Hygiene: Moderate assistance;Sit to/from stand Toileting - Clothing Manipulation Details (indicate cue type and reason): physical assist for completion in standing with pt standing at min guard level with rw Tub/ Shower Transfer: Minimal assistance;Stand-pivot;3 in 1;Rolling walker     General ADL Comments: Pt completing toileting hygiene with family assist following SPT to [facility] upon therapist arrival. Pt stood in static stand position with rw while family completed perianal care. Pt then completed sit<>stand to recliner with min A. Pt limited by pain this session. ADL education provided with pt and family having good recall from previous hip surgery,      Vision     Perception     Praxis      Pertinent Vitals/Pain Pain Assessment: 0-10 Pain Score: 6  Pain Location: Lt hip Pain Descriptors / Indicators: Aching;Grimacing Pain Intervention(s): Limited activity within patient's tolerance;Monitored during session;Repositioned;RN gave pain meds during session     Hand Dominance  Right   Extremity/Trunk Assessment Upper Extremity Assessment Upper Extremity Assessment: Overall WFL for tasks assessed;Generalized weakness   Lower Extremity Assessment Lower Extremity Assessment: Defer to PT evaluation       Communication Communication Communication: No difficulties   Cognition  Arousal/Alertness: Awake/alert Behavior During Therapy: Anxious Overall Cognitive Status: Within Functional Limits for tasks assessed                     General Comments       Exercises       Shoulder Instructions      Home Living Family/patient expects to be discharged to:: Skilled nursing facility Living Arrangements: Spouse/significant other Available Help at Discharge: Family;Available PRN/intermittently Type of Home: House Home Access: Level entry     Home Layout: One level               Home Equipment: Walker - 4 wheels;Bedside commode;Walker - 2 wheels;Shower seat - built in;Adaptive equipment;Grab bars - toilet;Grab bars - tub/shower Adaptive Equipment: Reacher;Sock aid;Long-handled shoe horn;Long-handled sponge        Prior Functioning/Environment Level of Independence: Needs assistance  Gait / Transfers Assistance Needed: amb w/ rollator w/ assist from husband holding it in place when necessary ADL's / Homemaking Assistance Needed: min guard for walk-in shower transfer to shower seat, used grab bars for toilet/shower transfers; used rollator        OT Diagnosis: Generalized weakness;Acute pain   OT Problem List: Decreased activity tolerance;Decreased strength;Impaired balance (sitting and/or standing);Decreased knowledge of use of DME or AE;Decreased knowledge of precautions;Pain   OT Treatment/Interventions: Self-care/ADL training;DME and/or AE instruction;Therapeutic activities;Patient/family education;Balance training;Energy conservation    OT Goals(Current goals can be found in the care plan section) Acute Rehab OT Goals Patient Stated Goal: to go to rehab then home OT Goal Formulation: With patient/family Time For Goal Achievement: 05/07/15 Potential to Achieve Goals: Good ADL Goals Pt Will Perform Lower Body Bathing: with adaptive equipment;sit to/from stand;with min guard assist Pt Will Perform Lower Body Dressing: with adaptive  equipment;sit to/from stand;with min guard assist Pt Will Transfer to Toilet: with min guard assist;ambulating;bedside commode Pt Will Perform Toileting - Clothing Manipulation and hygiene: with min guard assist;sit to/from stand;with adaptive equipment Additional ADL Goal #1: Pt will complete bed mobility at mod I level to prepare for OOB ADLs.  OT Frequency: Min 2X/week   Barriers to D/C:            Co-evaluation              End of Session Equipment Utilized During Treatment: Rolling walker  Activity Tolerance: Patient limited by pain Patient left: in chair;with call bell/phone within reach;with family/visitor present   Time: 7673-4193 OT Time Calculation (min): 14 min Charges:  OT General Charges $OT Visit: 1 Procedure OT Evaluation $Initial OT Evaluation Tier I: 1 Procedure G-Codes:    Hortencia Pilar 2015-05-02, 11:09 AM

## 2015-05-01 LAB — CBC
HEMATOCRIT: 28.7 % — AB (ref 36.0–46.0)
Hemoglobin: 9.1 g/dL — ABNORMAL LOW (ref 12.0–15.0)
MCH: 28.2 pg (ref 26.0–34.0)
MCHC: 31.7 g/dL (ref 30.0–36.0)
MCV: 88.9 fL (ref 78.0–100.0)
PLATELETS: 249 10*3/uL (ref 150–400)
RBC: 3.23 MIL/uL — ABNORMAL LOW (ref 3.87–5.11)
RDW: 15.3 % (ref 11.5–15.5)
WBC: 10 10*3/uL (ref 4.0–10.5)

## 2015-05-01 LAB — GLUCOSE, CAPILLARY
Glucose-Capillary: 153 mg/dL — ABNORMAL HIGH (ref 65–99)
Glucose-Capillary: 120 mg/dL — ABNORMAL HIGH (ref 65–99)

## 2015-05-01 MED ORDER — METHOCARBAMOL 500 MG PO TABS: 500.0000 mg | ORAL_TABLET | Freq: Four times a day (QID) | ORAL | Status: AC | PRN

## 2015-05-01 MED ORDER — OXYCODONE-ACETAMINOPHEN 5-325 MG PO TABS: 1.0000 | ORAL_TABLET | ORAL | Status: AC | PRN

## 2015-05-01 MED ORDER — OXYCODONE HCL ER 20 MG PO T12A: 20.0000 mg | EXTENDED_RELEASE_TABLET | Freq: Two times a day (BID) | ORAL | Status: AC

## 2015-05-01 MED ORDER — RIVAROXABAN 20 MG PO TABS: 20.0000 mg | ORAL_TABLET | Freq: Every day | ORAL | Status: AC

## 2015-05-01 MED ORDER — TERBINAFINE HCL 1 % EX CREA: TOPICAL_CREAM | Freq: Every day | CUTANEOUS | Status: AC

## 2015-05-01 NOTE — Clinical Social Work Note (Signed)
Clinical Social Work Assessment  Patient Details  Name: Ashley Sims MRN: [id number] Date of Birth: Mar 21, 1951  Date of referral:  05/01/15               Reason for consult:  Facility Placement                Permission sought to share information with:  Facility Sport and exercise psychologist, Family Supports Permission granted to share information::  Yes, Verbal Permission Granted  Name::      (husband)  Agency::   ([facility])  Relationship::     Contact Information:     Housing/Transportation Living arrangements for the past 2 months:  Single Family Home Source of Information:  Patient, Spouse Patient Interpreter Needed:  None Criminal Activity/Legal Involvement Pertinent to Current Situation/Hospitalization:  No - Comment as needed Significant Relationships:  Spouse Lives with:  Spouse Do you feel safe going back to the place where you live?  No (fall risk) Need for family participation in patient care:     Care giving concerns:  Fall risk- patient and husband wish for patient to receive STR at [facility] as she did prior with her "other hip surgery"   Social Worker assessment / plan:  Patient is a bundled patient and the physician and patient have chosen [facility] for her STR.  Patient to be transported via [place].  Patient has hx of hip surgery (opposite hip) and states she will be glad to get STR completed and back home.  Employment status:  Retired Forensic scientist:    PT Recommendations:  [place] / Referral to community resources:  [place]  Patient/Family's Response to care:  Patient and husband appreciative of CSW assistance and agreeable to SNF placement for STR.  Patient/Family's Understanding of and Emotional Response to Diagnosis, Current Treatment, and Prognosis:  Patient and husband are both realistic regarding prognosis and plan of care needed after discharge.  Emotional Assessment Appearance:  Appears stated  age Attitude/Demeanor/Rapport:   (appropriate) Affect (typically observed):  Accepting Orientation:  Oriented to Self, Oriented to  Time, Oriented to Place, Oriented to Situation Alcohol / Substance use:  Never Used Psych involvement (Current and /or in the community):  No (Comment)  Discharge Needs  Concerns to be addressed:  No discharge needs identified Readmission within the last 30 days:    Current discharge risk:  None Barriers to Discharge:  No Barriers Identified   Dulcy Fanny, LCSW 05/01/2015, 11:11 AM

## 2015-05-01 NOTE — Discharge Planning (Signed)
Report given to [place] at [facility], patient going to [phone]

## 2015-05-01 NOTE — Progress Notes (Signed)
Physical Therapy Treatment Patient Details Name: Ashley Sims MRN: [id number] DOB: 1950-12-30 Today's Date: 05/01/2015    History of Present Illness Pt is a 64 y/o F s/p L THA.  Pt's PMH includes DVT, DM, HTN, depression, RA, chronic back pain, morbid obesity, peripheral edema, UTI, R THA.     PT Comments    Patient still requiring several rest breaks throughout session. Progressing slowly. Plan is to DC to [place] [facility] maybe by this afternoon.   Follow Up Recommendations  SNF;Supervision for mobility/OOB     Equipment Recommendations  None recommended by PT    Recommendations for Other Services       Precautions / Restrictions Precautions Precautions: Fall Restrictions Weight Bearing Restrictions: Yes LLE Weight Bearing: Weight bearing as tolerated    Mobility  Bed Mobility Overal bed mobility: Needs Assistance Bed Mobility: Supine to Sit;Sit to Supine     Supine to sit: Min assist Sit to supine: Mod assist   General bed mobility comments: A for LEs in and out of bed. Heavy reliance on rails. Cues for positioning.   Transfers Overall transfer level: Needs assistance Equipment used: Rolling walker (2 wheeled)   Sit to Stand: Min assist         General transfer comment: cueing for technique  Ambulation/Gait Ambulation/Gait assistance: Min guard Ambulation Distance (Feet): 40 Feet Assistive device: Rolling walker (2 wheeled) Gait Pattern/deviations: Step-to pattern Gait velocity: decreased   General Gait Details: Cues for RW placement due to patient either placing too close and then too far.  Several standing rest breaks as patient stated that her arms became tired. CUes to increase weight through LEs.    Stairs            Wheelchair Mobility    Modified Rankin (Stroke Patients Only)       Balance                                    Cognition Arousal/Alertness: Awake/alert Behavior During Therapy: WFL for tasks  assessed/performed Overall Cognitive Status: Within Functional Limits for tasks assessed                      Exercises Total Joint Exercises Quad Sets: AROM;10 reps;Supine;Left Hip ABduction/ADduction: AAROM;AROM;Left;10 reps;Supine    General Comments        Pertinent Vitals/Pain Pain Score: 6  Pain Location: L hip Pain Descriptors / Indicators: Aching;Sore Pain Intervention(s): Monitored during session    Home Living                      Prior Function            PT Goals (current goals can now be found in the care plan section) Progress towards PT goals: Progressing toward goals    Frequency  7X/week    PT Plan Current plan remains appropriate    Co-evaluation             End of Session   Activity Tolerance: Patient tolerated treatment well Patient left: in bed;with call bell/phone within reach     Time: 1041-1110 PT Time Calculation (min) (ACUTE ONLY): 29 min  Charges:  $Gait Training: 8-22 mins $Therapeutic Exercise: 8-22 mins                    G Codes:      Ashley Sims 05/01/2015, 11:16  AM  05/01/2015 Ashley Sims PTA [phone] pager [phone] office

## 2015-05-01 NOTE — Clinical Social Work Note (Addendum)
PTAR scheduled   [name], LCSW [phone]  Psychiatric & Orthopedics (5N 1-8) Clinical Social Worker

## 2015-05-01 NOTE — Discharge Summary (Signed)
Patient ID: Ashley Sims MRN: [id number] DOB/AGE: [birth year] 64 y.o.  Admit date: 04/29/2015 Discharge date: 05/01/2015  Admission Diagnoses:  Principal Problem:   Primary osteoarthritis of left hip Active Problems:   Diabetes   Morbid obesity   Ringworm of body   Discharge Diagnoses:  Same  Past Medical History  Diagnosis Date  . Insomnia     takes Elavil nightly  . Hypertension     takes Lisinopril daily  . Depression     takes Zoloft daily  . Hyperlipidemia     takes Simvastatin daily  . Chronic back pain     [facility];takes Oxycontin and Percocet  . Joint pain   . Joint swelling   . GERD (gastroesophageal reflux disease)     takes Protonix daily  . History of colon polyps   . Stress incontinence   . Peripheral edema     left foot  . UTI (lower urinary tract infection)     was treated with Cipro 3wks ago  . Type II diabetes mellitus     takes Metformin daily  . DVT (deep venous thrombosis) 08/01/2014    RLE  . Migraine     "haven't had a migraine since 1970's"  . Rheumatoid arthritis     RA and osteo;takes Plaquenil daily and takes Methotrexate daily  . Osteoarthritis   . Uterine cancer     Surgeries: Procedure(s): TOTAL HIP ARTHROPLASTY ANTERIOR APPROACH on 04/29/2015   Consultants:    Discharged Condition: Improved  Hospital Course: Ashley Sims is an 64 y.o. female who was admitted 04/29/2015 for operative treatment ofPrimary osteoarthritis of left hip. Patient has severe unremitting pain that affects sleep, daily activities, and work/hobbies. After pre-op clearance the patient was taken to the operating room on 04/29/2015 and underwent  Procedure(s): TOTAL HIP ARTHROPLASTY ANTERIOR APPROACH.    Patient was given perioperative antibiotics: Anti-infectives    Start     Dose/Rate Route Frequency Ordered Stop   04/29/15 1430  ceFAZolin (ANCEF) IVPB 2 g/50 mL premix     2 g 100 mL/hr over 30 Minutes Intravenous Every 6 hours  04/29/15 1216 04/30/15 0231   04/29/15 0630  ceFAZolin (ANCEF) IVPB 2 g/50 mL premix     2 g 100 mL/hr over 30 Minutes Intravenous To [facility] 04/28/15 1302 04/29/15 0740       Patient was given sequential compression devices, early ambulation, and chemoprophylaxis to prevent DVT.  Patient benefited maximally from hospital stay and there were no complications.    Recent vital signs: Patient Vitals for the past 24 hrs:  BP Temp Pulse Resp SpO2  05/01/15 0510 (!) 122/41 mmHg 98.4 F (36.9 C) 84 17 96 %  04/30/15 2015 (!) 114/41 mmHg 98.2 F (36.8 C) 87 17 95 %     Recent laboratory studies:  Recent Labs  04/29/15 0908 04/30/15 0450 05/01/15 0403  WBC  --  10.1 10.0  HGB 11.2* 9.7* 9.1*  HCT 33.0* 30.6* 28.7*  PLT  --  265 249  NA 136 137  --   K 4.0 4.2  --   CL  --  100*  --   CO2  --  28  --   BUN  --  11  --   CREATININE  --  0.80  --   GLUCOSE 177* 158*  --   CALCIUM  --  9.0  --      Discharge Medications:     Medication List  STOP taking these medications        aspirin 325 MG EC tablet     MOBIC 15 MG tablet  Generic drug:  meloxicam      TAKE these medications        calcium carbonate 600 MG Tabs tablet  Commonly known as:  OS-CAL  Take 600 mg by mouth 2 (two) times daily with a meal.     cholecalciferol 1000 UNITS tablet  Commonly known as:  VITAMIN D  Take 1,000 Units by mouth daily.     DULoxetine 60 MG capsule  Commonly known as:  CYMBALTA  Take 60 mg by mouth daily.     ferrous sulfate 325 (65 FE) MG tablet  Take 1 tablet (325 mg total) by mouth 2 (two) times daily with a meal.     furosemide 20 MG tablet  Commonly known as:  LASIX  Take 20 mg by mouth daily.     gabapentin 300 MG capsule  Commonly known as:  NEURONTIN  Take 300 mg by mouth 2 (two) times daily.     lisinopril 10 MG tablet  Commonly known as:  PRINIVIL,ZESTRIL  Take 10 mg by mouth daily.     metFORMIN 500 MG tablet  Commonly known as:  GLUCOPHAGE   Take 500 mg by mouth 2 (two) times daily with a meal.     methocarbamol 500 MG tablet  Commonly known as:  ROBAXIN  Take 1 tablet (500 mg total) by mouth every 6 (six) hours as needed for muscle spasms.     oxybutynin 10 MG 24 hr tablet  Commonly known as:  DITROPAN-XL  Take 10 mg by mouth at bedtime.     OxyCODONE 20 mg T12a 12 hr tablet  Commonly known as:  OXYCONTIN  Take 1 tablet (20 mg total) by mouth every 12 (twelve) hours.     oxyCODONE-acetaminophen 5-325 MG per tablet  Commonly known as:  PERCOCET/ROXICET  Take 1-2 tablets by mouth every 4 (four) hours as needed for severe pain.     pantoprazole 40 MG tablet  Commonly known as:  PROTONIX  Take 40 mg by mouth daily.     rivaroxaban 20 MG Tabs tablet  Commonly known as:  XARELTO  Take 1 tablet (20 mg total) by mouth daily with supper.     senna 8.6 MG Tabs tablet  Commonly known as:  SENOKOT  Take 2 tablets (17.2 mg total) by mouth daily.     sertraline 50 MG tablet  Commonly known as:  ZOLOFT  Take 50 mg by mouth daily.     simvastatin 40 MG tablet  Commonly known as:  ZOCOR  Take 40 mg by mouth daily.     terbinafine 1 % cream  Commonly known as:  LAMISIL  Apply topically daily.        Diagnostic Studies: Dg Hip Operative Unilat With Pelvis Left  04/29/2015   CLINICAL DATA:  As post anterior approach left total hip arthroplasty  EXAM: OPERATIVE left HIP (WITH PELVIS IF PERFORMED)  VIEWS  TECHNIQUE: Fluoroscopic spot image(s) were submitted for interpretation post-operatively.  FLUOROSCOPY TIME:  Fluoroscopy Time:  0 minutes, 39 seconds  Number of Acquired Images:  2  COMPARISON:  None.  FINDINGS: The patient has undergone left total hip arthroplasty. Radiographic positioning of the prosthetic components is good. The interface with the native bone is unremarkable.  IMPRESSION: There is no immediate postprocedure complication following left total hip arthroplasty.   Electronically Signed  By: Ashley  Sims  M.D.   On: 04/29/2015 11:21    Disposition: 01-Home or Self Care      Discharge Instructions    Call MD / Call 911    Complete by:  As directed   If you experience chest pain or shortness of breath, CALL 911 and be transported to the hospital emergency room.  If you develope a fever above 101 F, pus (white drainage) or increased drainage or redness at the wound, or calf pain, call your surgeon's office.     Constipation Prevention    Complete by:  As directed   Drink plenty of fluids.  Prune juice may be helpful.  You may use a stool softener, such as Colace (over the counter) 100 mg twice a day.  Use MiraLax (over the counter) for constipation as needed.     Diet - low sodium heart healthy    Complete by:  As directed      Discharge instructions    Complete by:  As directed   INSTRUCTIONS AFTER JOINT REPLACEMENT   Remove items at home which could result in a fall. This includes throw rugs or furniture in walking pathways ICE to the affected joint every three hours while awake for 30 minutes at a time, for at least the first 3-5 days, and then as needed for pain and swelling.  Continue to use ice for pain and swelling. You may notice swelling that will progress down to the foot and ankle.  This is normal after surgery.  Elevate your leg when you are not up walking on it.   Continue to use the breathing machine you got in the hospital (incentive spirometer) which will help keep your temperature down.  It is common for your temperature to cycle up and down following surgery, especially at night when you are not up moving around and exerting yourself.  The breathing machine keeps your lungs expanded and your temperature down.   DIET:  As you were doing prior to hospitalization, we recommend a well-balanced diet.  DRESSING / WOUND CARE / SHOWERING  You may shower 3 days after surgery, but keep the wounds dry during showering.  You may use an occlusive plastic wrap (Press'n Seal for example),  NO SOAKING/SUBMERGING IN THE BATHTUB.  If the bandage gets wet, change with a clean dry gauze.  If the incision gets wet, pat the wound dry with a clean towel.  ACTIVITY  Increase activity slowly as tolerated, but follow the weight bearing instructions below.   No driving for 6 weeks or until further direction given by your physician.  You cannot drive while taking narcotics.  No lifting or carrying greater than 10 lbs. until further directed by your surgeon. Avoid periods of inactivity such as sitting longer than an hour when not asleep. This helps prevent blood clots.  You may return to work once you are authorized by your doctor.     WEIGHT BEARING   Weight bearing as tolerated with assist device (walker, cane, etc) as directed, use it as long as suggested by your surgeon or therapist, typically at least 4-6 weeks.   EXERCISES  Results after joint replacement surgery are often greatly improved when you follow the exercise, range of motion and muscle strengthening exercises prescribed by your doctor. Safety measures are also important to protect the joint from further injury. Any time any of these exercises cause you to have increased pain or swelling, decrease what you are doing until you  are comfortable again and then slowly increase them. If you have problems or questions, call your caregiver or physical therapist for advice.   Rehabilitation is important following a joint replacement. After just a few days of immobilization, the muscles of the leg can become weakened and shrink (atrophy).  These exercises are designed to build up the tone and strength of the thigh and leg muscles and to improve motion. Often times heat used for twenty to thirty minutes before working out will loosen up your tissues and help with improving the range of motion but do not use heat for the first two weeks following surgery (sometimes heat can increase post-operative swelling).   These exercises can be done  on a training (exercise) mat, on the floor, on a table or on a bed. Use whatever works the best and is most comfortable for you.    Use music or television while you are exercising so that the exercises are a pleasant break in your day. This will make your life better with the exercises acting as a break in your routine that you can look forward to.   Perform all exercises about fifteen times, three times per day or as directed.  You should exercise both the operative leg and the other leg as well.   Exercises include:   Quad Sets - Tighten up the muscle on the front of the thigh (Quad) and hold for 5-10 seconds.   Straight Leg Raises - With your knee straight (if you were given a brace, keep it on), lift the leg to 60 degrees, hold for 3 seconds, and slowly lower the leg.  Perform this exercise against resistance later as your leg gets stronger.  Leg Slides: Lying on your back, slowly slide your foot toward your buttocks, bending your knee up off the floor (only go as far as is comfortable). Then slowly slide your foot back down until your leg is flat on the floor again.  Angel Wings: Lying on your back spread your legs to the side as far apart as you can without causing discomfort.  Hamstring Strength:  Lying on your back, push your heel against the floor with your leg straight by tightening up the muscles of your buttocks.  Repeat, but this time bend your knee to a comfortable angle, and push your heel against the floor.  You may put a pillow under the heel to make it more comfortable if necessary.   A rehabilitation program following joint replacement surgery can speed recovery and prevent re-injury in the future due to weakened muscles. Contact your doctor or a physical therapist for more information on knee rehabilitation.    CONSTIPATION  Constipation is defined medically as fewer than three stools per week and severe constipation as less than one stool per week.  Even if you have a regular  bowel pattern at home, your normal regimen is likely to be disrupted due to multiple reasons following surgery.  Combination of anesthesia, postoperative narcotics, change in appetite and fluid intake all can affect your bowels.   YOU MUST use at least one of the following options; they are listed in order of increasing strength to get the job done.  They are all available over the counter, and you may need to use some, POSSIBLY even all of these options:    Drink plenty of fluids (prune juice may be helpful) and high fiber foods Colace 100 mg by mouth twice a day  Senokot for constipation as directed and  as needed Dulcolax (bisacodyl), take with full glass of water  Miralax (polyethylene glycol) once or twice a day as needed.  If you have tried all these things and are unable to have a bowel movement in the first 3-4 days after surgery call either your surgeon or your primary doctor.    If you experience loose stools or diarrhea, hold the medications until you stool forms back up.  If your symptoms do not get better within 1 week or if they get worse, check with your doctor.  If you experience "the worst abdominal pain ever" or develop nausea or vomiting, please contact the office immediately for further recommendations for treatment.   ITCHING:  If you experience itching with your medications, try taking only a single pain pill, or even half a pain pill at a time.  You can also use Benadryl over the counter for itching or also to help with sleep.   TED HOSE STOCKINGS:  Use stockings on both legs until for at least 2 weeks or as directed by physician office. They may be removed at night for sleeping.  MEDICATIONS:  See your medication summary on the "After Visit Summary" that nursing will review with you.  You may have some home medications which will be placed on hold until you complete the course of blood thinner medication.  It is important for you to complete the blood thinner medication as  prescribed.  PRECAUTIONS:  If you experience chest pain or shortness of breath - call 911 immediately for transfer to the hospital emergency department.   If you develop a fever greater that 101 F, purulent drainage from wound, increased redness or drainage from wound, foul odor from the wound/dressing, or calf pain - CONTACT YOUR SURGEON.                                                   FOLLOW-UP APPOINTMENTS:  If you do not already have a post-op appointment, please call the office for an appointment to be seen by your surgeon.  Guidelines for how soon to be seen are listed in your "After Visit Summary", but are typically between 1-4 weeks after surgery.  OTHER INSTRUCTIONS:   Knee Replacement:  Do not place pillow under knee, focus on keeping the knee straight while resting. CPM instructions: 0-90 degrees, 2 hours in the morning, 2 hours in the afternoon, and 2 hours in the evening. Place foam block, curve side up under heel at all times except when in CPM or when walking.  DO NOT modify, tear, cut, or change the foam block in any way.  MAKE SURE YOU:  Understand these instructions.  Get help right away if you are not doing well or get worse.    Thank you for letting us be a part of your medical care team.  It is a privilege we respect greatly.  We hope these instructions will help you stay on track for a fast and full recovery!     Increase activity slowly as tolerated    Complete by:  As directed            Follow-up Information    Follow up with Hessie Dibble, MD. Schedule an appointment as soon as possible for a visit in 2 weeks.   Specialty:  Orthopedic Surgery   Contact information:  [place] [address] [phone]        Signed: Rich Fuchs 05/01/2015, 2:55 PM

## 2015-05-01 NOTE — Care Management (Signed)
Important Message  Patient Details  Name: Ashley Sims MRN: [id number] Date of Birth: Jun 24, 1951   Medicare Important Message Given:  Yes-second notification given    Delorse Lek 05/01/2015, 10:30 AM

## 2015-05-01 NOTE — Clinical Social Work Placement (Signed)
   CLINICAL SOCIAL WORK PLACEMENT  NOTE  Date:  05/01/2015  Patient Details  Name: Ashley Sims MRN: [id number] Date of Birth: 1950-11-26  Clinical Social Work is seeking post-discharge placement for this patient at the [place] level of care (*CSW will initial, date and re-position this form in  chart as items are completed):  Yes   Patient/family provided with [facility] Work Department's list of facilities offering this level of care within the geographic area requested by the patient (or if unable, by the patient's family).  Yes   Patient/family informed of their freedom to choose among providers that offer the needed level of care, that participate in Medicare, Medicaid or managed care program needed by the patient, have an available bed and are willing to accept the patient.  Yes   Patient/family informed of [facility]'s ownership interest in [facility] and [facility], as well as of the fact that they are under no obligation to receive care at these facilities.  PASRR submitted to EDS on       PASRR number received on       Existing PASRR number confirmed on 05/01/15     FL2 transmitted to all facilities in geographic area requested by pt/family on 05/01/15     FL2 transmitted to all facilities within larger geographic area on       Patient informed that his/her managed care company has contracts with or will negotiate with certain facilities, including the following:        Yes   Patient/family informed of bed offers received.  Patient chooses bed at  [facility] place SNF)     Physician recommends and patient chooses bed at  [facility])    Patient to be transferred to [facility] on 05/01/15.  Patient to be transferred to facility by [facility]     Patient family notified on 05/01/15 of transfer.  Name of family member notified:  [name], husband     PHYSICIAN Please prepare priority discharge summary, including  medications     Additional Comment:    _______________________________________________ Dulcy Fanny, LCSW 05/01/2015, 11:14 AM

## 2015-05-02 ENCOUNTER — Non-Acute Institutional Stay (SKILLED_NURSING_FACILITY): Payer: Medicare Other | Admitting: Adult Health

## 2015-05-02 ENCOUNTER — Encounter: Payer: Self-pay | Admitting: Adult Health

## 2015-05-02 DIAGNOSIS — IMO0002 Reserved for concepts with insufficient information to code with codable children: Secondary | ICD-10-CM

## 2015-05-02 DIAGNOSIS — E785 Hyperlipidemia, unspecified: Secondary | ICD-10-CM

## 2015-05-02 DIAGNOSIS — K219 Gastro-esophageal reflux disease without esophagitis: Secondary | ICD-10-CM | POA: Diagnosis not present

## 2015-05-02 DIAGNOSIS — F329 Major depressive disorder, single episode, unspecified: Secondary | ICD-10-CM | POA: Diagnosis not present

## 2015-05-02 DIAGNOSIS — E1140 Type 2 diabetes mellitus with diabetic neuropathy, unspecified: Secondary | ICD-10-CM

## 2015-05-02 DIAGNOSIS — F32A Depression, unspecified: Secondary | ICD-10-CM

## 2015-05-02 DIAGNOSIS — B354 Tinea corporis: Secondary | ICD-10-CM | POA: Diagnosis not present

## 2015-05-02 DIAGNOSIS — K5900 Constipation, unspecified: Secondary | ICD-10-CM

## 2015-05-02 DIAGNOSIS — D509 Iron deficiency anemia, unspecified: Secondary | ICD-10-CM

## 2015-05-02 DIAGNOSIS — N393 Stress incontinence (female) (male): Secondary | ICD-10-CM

## 2015-05-02 DIAGNOSIS — E1165 Type 2 diabetes mellitus with hyperglycemia: Secondary | ICD-10-CM | POA: Diagnosis not present

## 2015-05-02 DIAGNOSIS — M1612 Unilateral primary osteoarthritis, left hip: Secondary | ICD-10-CM

## 2015-05-02 DIAGNOSIS — G629 Polyneuropathy, unspecified: Secondary | ICD-10-CM | POA: Diagnosis not present

## 2015-05-04 NOTE — Progress Notes (Signed)
Patient ID: Ashley Sims, female   DOB: Oct 01, 1951, 64 y.o.   MRN: [id number]   05/02/15  Facility:  N[facility]ing Home Location:  [place] Room Number: 408-P LEVEL OF CARE:  SNF (31)   Chief Complaint  Patient presents with  . Hospitalization Follow-up    Osteoarthritis S/P left total hip arthroplasty, hyperlipidemia, diabetes mellitus type 2, hypertension, neuropathy, depression, anemia, stress incontinence, GERD, constipation and body ring worm    HISTORY OF PRESENT ILLNESS:  This is a 64 year old female who has been admitted to [facility] on 05/01/15 from [facility] with osteoarthritis S/P Left total hip arthroplasty. She has PMH of insomnia, hypertension, depression, hyperlipidemia, chronic back pain, joint pain, GERD, history of colon polyps, stress incontinence, left foot edema, type 2 diabetes mellitus, DVT and uterine cancer.  She has been admitted for a short-term rehabilitation.  PAST MEDICAL HISTORY:  Past Medical History  Diagnosis Date  . Insomnia     takes Elavil nightly  . Hypertension     takes Lisinopril daily  . Depression     takes Zoloft daily  . Hyperlipidemia     takes Simvastatin daily  . Chronic back pain     [facility];takes Oxycontin and Percocet  . Joint pain   . Joint swelling   . GERD (gastroesophageal reflux disease)     takes Protonix daily  . History of colon polyps   . Stress incontinence   . Peripheral edema     left foot  . UTI (lower urinary tract infection)     was treated with Cipro 3wks ago  . Type II diabetes mellitus     takes Metformin daily  . DVT (deep venous thrombosis) 08/01/2014    RLE  . Migraine     "haven't had a migraine since 1970's"  . Rheumatoid arthritis     RA and osteo;takes Plaquenil daily and takes Methotrexate daily  . Osteoarthritis   . Uterine cancer     CURRENT MEDICATIONS: Reviewed per MAR/see medication list  Allergies  Allergen  Reactions  . Bee Venom     blisters  . Tramadol     Sick on the stomach     REVIEW OF SYSTEMS:  GENERAL: no change in appetite, no fatigue, no weight changes, no fever, chills or weakness RESPIRATORY: no cough, SOB, DOE, wheezing, hemoptysis CARDIAC: no chest pain, edema or palpitations GI: no abdominal pain, diarrhea, constipation, heart burn, nausea or vomiting  PHYSICAL EXAMINATION spots  GENERAL: no acute distress, obese SKIN:  Left hip surgical incision is dry, covered with dry dressing, no erythema; red round area on right upper back and dry rounded area on back of left hand EYES: conjunctivae normal, sclerae normal, normal eye lids NECK: supple, trachea midline, no neck masses, no thyroid tenderness, no thyromegaly LYMPHATICS: no LAN in the neck, no supraclavicular LAN RESPIRATORY: breathing is even & unlabored, BS CTAB CARDIAC: RRR, no murmur,no extra heart sounds, no edema GI: abdomen soft, normal BS, no masses, no tenderness, no hepatomegaly, no splenomegaly EXTREMITIES:  Able to move 4 extremities PSYCHIATRIC: the patient is alert & oriented to person, affect & behavior appropriate  LABS/RADIOLOGY: Labs reviewed: Basic Metabolic Panel:  Recent Labs  08/02/14 0315 04/18/15 1236 04/29/15 0908 04/30/15 0450  NA 135* 140 136 137  K 3.9 4.2 4.0 4.2  CL 97 105  --  100*  CO2 28 26  --  28  GLUCOSE 136* 117*  177* 158*  BUN 12 15  --  11  CREATININE 0.83 0.84  --  0.80  CALCIUM 9.2 10.1  --  9.0   Liver Function Tests:  Recent Labs  08/02/14 0315  AST 14  ALT 6  ALKPHOS 99  BILITOT 0.5  PROT 6.4  ALBUMIN 2.8*   CBC:  Recent Labs  07/05/14 1140  12/23/14 1445 04/18/15 1236 04/29/15 0908 04/30/15 0450 05/01/15 0403  WBC 6.0  < > 5.5 5.9  --  10.1 10.0  NEUTROABS 4.9  --  4.2 4.4  --   --   --   HGB 13.3  < > 11.9* 13.1 11.2* 9.7* 9.1*  HCT 40.7  < > 37.3 40.7 33.0* 30.6* 28.7*  MCV 94.2  < > 86.3 88.5  --  87.7 88.9  PLT 301  < > 332 282  --   265 249  < > = values in this interval not displayed.  CBG:  Recent Labs  04/30/15 2151 05/01/15 0615 05/01/15 1128  GLUCAP 158* 153* 120*    Dg Hip Operative Unilat With Pelvis Left  04/29/2015   CLINICAL DATA:  As post anterior approach left total hip arthroplasty  EXAM: OPERATIVE left HIP (WITH PELVIS IF PERFORMED)  VIEWS  TECHNIQUE: Fluoroscopic spot image(s) were submitted for interpretation post-operatively.  FLUOROSCOPY TIME:  Fluoroscopy Time:  0 minutes, 39 seconds  Number of Acquired Images:  2  COMPARISON:  None.  FINDINGS: The patient has undergone left total hip arthroplasty. Radiographic positioning of the prosthetic components is good. The interface with the native bone is unremarkable.  IMPRESSION: There is no immediate postprocedure complication following left total hip arthroplasty.   Electronically Signed   By: David  Martinique M.D.   On: 04/29/2015 11:21    ASSESSMENT/PLAN:  Osteoarthritis S/P left total hip arthroplasty - for rehabilitation; weightbearing as tolerated; follow-up with Dr. Melrose Nakayama, orthopedic surgeon, in 2 weeks; continue Robaxin 500 mg 1 tab by mouth every 6 hours when necessary for muscle spasm; Percocet 5/325 mg 1-2 tabs by mouth every 4 hours when necessary for pain; and Xarelto 20 mg 1 tab by mouth daily for DVT prophylaxis  Hyperlipidemia - continue Zocor 40 grams one tab by mouth daily  Diabetes mellitus, type II with Neuropathy - continue metformin 500 mg 1 tab by mouth twice a day with meals  Hypertension - well controlled; continue lisinopril 10 mg 1 tab by mouth daily  Neuropathy - continue gabapentin 300 mg 1 capsule by mouth twice a day  Depression - mood is stable; continue Cymbalta 60 mg 1 capsule by mouth daily  Iron deficiency anemia - hemoglobin 9.1; continue ferrous sulfate 325 mg 1 tab by mouth 3 times a day  Stress incontinence - continue oxybutynin 10 mg 24 hour 1 tab by mouth daily at bedtime  GERD - continue Protonix  40 mg 1 tab by mouth daily  Constipation - this continue senna; start senna S2 tabs by mouth twice a day and MiraLAX 17 g +4-6 ounces liquid by mouth twice a day   Ring warmth in the body - continue to apply Lamisil 1% cream topically daily   Goals of care:  Short-term rehabilitation   Labs/test ordered:   CBC and CMP  Spent 50 minutes in patient care.    [facility], NP [employer] [phone]

## 2015-05-08 ENCOUNTER — Non-Acute Institutional Stay (SKILLED_NURSING_FACILITY): Payer: Medicare Other | Admitting: Adult Health

## 2015-05-08 ENCOUNTER — Encounter: Payer: Self-pay | Admitting: Adult Health

## 2015-05-08 DIAGNOSIS — E1165 Type 2 diabetes mellitus with hyperglycemia: Secondary | ICD-10-CM | POA: Diagnosis not present

## 2015-05-08 DIAGNOSIS — K219 Gastro-esophageal reflux disease without esophagitis: Secondary | ICD-10-CM

## 2015-05-08 DIAGNOSIS — B354 Tinea corporis: Secondary | ICD-10-CM

## 2015-05-08 DIAGNOSIS — E1140 Type 2 diabetes mellitus with diabetic neuropathy, unspecified: Secondary | ICD-10-CM | POA: Diagnosis not present

## 2015-05-08 DIAGNOSIS — IMO0002 Reserved for concepts with insufficient information to code with codable children: Secondary | ICD-10-CM

## 2015-05-08 DIAGNOSIS — M1612 Unilateral primary osteoarthritis, left hip: Secondary | ICD-10-CM

## 2015-05-08 DIAGNOSIS — G629 Polyneuropathy, unspecified: Secondary | ICD-10-CM

## 2015-05-08 DIAGNOSIS — K5900 Constipation, unspecified: Secondary | ICD-10-CM

## 2015-05-08 DIAGNOSIS — N393 Stress incontinence (female) (male): Secondary | ICD-10-CM | POA: Diagnosis not present

## 2015-05-08 DIAGNOSIS — F329 Major depressive disorder, single episode, unspecified: Secondary | ICD-10-CM

## 2015-05-08 DIAGNOSIS — D509 Iron deficiency anemia, unspecified: Secondary | ICD-10-CM

## 2015-05-08 DIAGNOSIS — E785 Hyperlipidemia, unspecified: Secondary | ICD-10-CM | POA: Diagnosis not present

## 2015-05-08 DIAGNOSIS — F32A Depression, unspecified: Secondary | ICD-10-CM

## 2015-05-08 NOTE — Progress Notes (Signed)
Patient ID: Ashley Sims, female   DOB: 1951/03/06, 64 y.o.   MRN: [id number]   05/08/15  Facility:  [facility] Home Location:  [place] Room Number: 408-P LEVEL OF CARE:  SNF (31)   Chief Complaint  Patient presents with  . Discharge Note    Osteoarthritis S/P left total hip arthroplasty, hyperlipidemia, diabetes mellitus type 2, hypertension, neuropathy, depression, anemia, stress incontinence, GERD, constipation and body ring worm    HISTORY OF PRESENT ILLNESS:  This is a 64 year old female who is for discharge home and will have outpatient rehabilitation. She has been admitted to [facility] on 05/01/15 from [facility] with osteoarthritis S/P Left total hip arthroplasty. She has PMH of insomnia, hypertension, depression, hyperlipidemia, chronic back pain, joint pain, GERD, history of colon polyps, stress incontinence, left foot edema, type 2 diabetes mellitus, DVT and uterine cancer.  Patient was admitted to this facility for short-term rehabilitation after the patient's recent hospitalization.  Patient has completed SNF rehabilitation and therapy has cleared the patient for discharge.  PAST MEDICAL HISTORY:  Past Medical History  Diagnosis Date  . Insomnia     takes Elavil nightly  . Hypertension     takes Lisinopril daily  . Depression     takes Zoloft daily  . Hyperlipidemia     takes Simvastatin daily  . Chronic back pain     [facility];takes Oxycontin and Percocet  . Joint pain   . Joint swelling   . GERD (gastroesophageal reflux disease)     takes Protonix daily  . History of colon polyps   . Stress incontinence   . Peripheral edema     left foot  . UTI (lower urinary tract infection)     was treated with Cipro 3wks ago  . Type II diabetes mellitus     takes Metformin daily  . DVT (deep venous thrombosis) 08/01/2014    RLE  . Migraine     "haven't had a migraine since 1970's"  . Rheumatoid  arthritis     RA and osteo;takes Plaquenil daily and takes Methotrexate daily  . Osteoarthritis   . Uterine cancer     CURRENT MEDICATIONS: Reviewed per MAR/see medication list  Allergies  Allergen Reactions  . Bee Venom     blisters  . Tramadol     Sick on the stomach     REVIEW OF SYSTEMS:  GENERAL: no change in appetite, no fatigue, no weight changes, no fever, chills or weakness RESPIRATORY: no cough, SOB, DOE, wheezing, hemoptysis CARDIAC: no chest pain, edema or palpitations GI: no abdominal pain, diarrhea, constipation, heart burn, nausea or vomiting  PHYSICAL EXAMINATION spots  GENERAL: no acute distress, obese SKIN:  Left hip surgical incision is dry, covered with dry dressing, no erythema; red round area on right upper back and dry rounded area on back of left hand NECK: supple, trachea midline, no neck masses, no thyroid tenderness, no thyromegaly LYMPHATICS: no LAN in the neck, no supraclavicular LAN RESPIRATORY: breathing is even & unlabored, BS CTAB CARDIAC: RRR, no murmur,no extra heart sounds, no edema GI: abdomen soft, normal BS, no masses, no tenderness, no hepatomegaly, no splenomegaly EXTREMITIES:  Able to move 4 extremities PSYCHIATRIC: the patient is alert & oriented to person, affect & behavior appropriate  LABS/RADIOLOGY: Labs reviewed: 05/06/15  WBC 6.2 hemoglobin 9.9 hematocrit 30.7 MCV 86.2 platelet 476 sodium 139 potassium 4.3 glucose 142 BUN 14 creatinine 0.75 total bilirubin 0.7  alkaline phosphatase 98 SGOT 17 SGPT 14 total protein 6.6 albumin 3.7 calcium 17.0 Basic Metabolic Panel:  Recent Labs  08/02/14 0315 04/18/15 1236 04/29/15 0908 04/30/15 0450  NA 135* 140 136 137  K 3.9 4.2 4.0 4.2  CL 97 105  --  100*  CO2 28 26  --  28  GLUCOSE 136* 117* 177* 158*  BUN 12 15  --  11  CREATININE 0.83 0.84  --  0.80  CALCIUM 9.2 10.1  --  9.0   Liver Function Tests:  Recent Labs  08/02/14 0315  AST 14  ALT 6  ALKPHOS 99  BILITOT  0.5  PROT 6.4  ALBUMIN 2.8*   CBC:  Recent Labs  07/05/14 1140  12/23/14 1445 04/18/15 1236 04/29/15 0908 04/30/15 0450 05/01/15 0403  WBC 6.0  < > 5.5 5.9  --  10.1 10.0  NEUTROABS 4.9  --  4.2 4.4  --   --   --   HGB 13.3  < > 11.9* 13.1 11.2* 9.7* 9.1*  HCT 40.7  < > 37.3 40.7 33.0* 30.6* 28.7*  MCV 94.2  < > 86.3 88.5  --  87.7 88.9  PLT 301  < > 332 282  --  265 249  < > = values in this interval not displayed.  CBG:  Recent Labs  04/30/15 2151 05/01/15 0615 05/01/15 1128  GLUCAP 158* 153* 120*    Dg Hip Operative Unilat With Pelvis Left  04/29/2015   CLINICAL DATA:  As post anterior approach left total hip arthroplasty  EXAM: OPERATIVE left HIP (WITH PELVIS IF PERFORMED)  VIEWS  TECHNIQUE: Fluoroscopic spot image(s) were submitted for interpretation post-operatively.  FLUOROSCOPY TIME:  Fluoroscopy Time:  0 minutes, 39 seconds  Number of Acquired Images:  2  COMPARISON:  None.  FINDINGS: The patient has undergone left total hip arthroplasty. Radiographic positioning of the prosthetic components is good. The interface with the native bone is unremarkable.  IMPRESSION: There is no immediate postprocedure complication following left total hip arthroplasty.   Electronically Signed   By: David  Martinique M.D.   On: 04/29/2015 11:21    ASSESSMENT/PLAN:  Osteoarthritis S/P left total hip arthroplasty - for outpatient rehabilitation; weightbearing as tolerated; follow-up with Dr. Melrose Nakayama, orthopedic surgeon; continue Robaxin 500 mg 1 tab by mouth every 6 hours when necessary for muscle spasm; Percocet 5/325 mg 1-2 tabs by mouth every 4 hours when necessary for pain; and Xarelto 20 mg 1 tab by mouth daily for DVT prophylaxis  Hyperlipidemia - continue Zocor 40 grams one tab by mouth daily  Diabetes mellitus, type II with Neuropathy -  hgba1c 6.9; continue metformin 500 mg 1 tab by mouth twice a day with meals  Hypertension - well controlled; continue lisinopril 10 mg 1  tab by mouth daily  Neuropathy - continue gabapentin 300 mg 1 capsule by mouth twice a day  Depression - mood is stable; continue Cymbalta 60 mg 1 capsule by mouth daily  Iron deficiency anemia - hemoglobin 9.1; continue ferrous sulfate 325 mg 1 tab by mouth 3 times a day  Stress incontinence - continue oxybutynin 10 mg 24 hour 1 tab by mouth daily at bedtime  GERD - continue Protonix 40 mg 1 tab by mouth daily  Constipation - this continue senna; start senna S2 tabs by mouth twice a day and MiraLAX 17 g +4-6 ounces liquid by mouth twice a day   Ring warmth in the body - continue to apply Lamisil  1% cream topically daily     I have filled out patient's discharge paperwork and written prescriptions.  Patient will have outpatient rehabilitation.  Total discharge time: Less than 30 minutes  Discharge time involved coordination of the discharge process with Education officer, museum, nursing staff and therapy department.   [facility], NP [employer] [phone]

## 2015-05-22 ENCOUNTER — Other Ambulatory Visit: Payer: Self-pay | Admitting: Adult Health

## 2015-05-30 ENCOUNTER — Other Ambulatory Visit: Payer: Self-pay | Admitting: Adult Health

## 2015-06-03 ENCOUNTER — Other Ambulatory Visit: Payer: Self-pay | Admitting: Obstetrics and Gynecology

## 2015-06-06 ENCOUNTER — Other Ambulatory Visit: Payer: Self-pay | Admitting: Obstetrics and Gynecology

## 2015-06-09 ENCOUNTER — Other Ambulatory Visit: Payer: Self-pay | Admitting: Obstetrics and Gynecology

## 2015-06-23 ENCOUNTER — Ambulatory Visit: Payer: Medicare Other | Admitting: Oncology

## 2015-07-12 ENCOUNTER — Other Ambulatory Visit: Payer: Self-pay | Admitting: Adult Health

## 2015-09-21 IMAGING — RF DG FLUORO GUIDE NDL PLC/BX
1 series · 1 of 1 positions shown · non-contrast
Comparison: none

CLINICAL DATA: Right hip and groin pain.  Osteoarthritis.

[Series 1: dg fluoro guide ndl plc/bx · 1 of 1 slices shown]
[im 1/1]
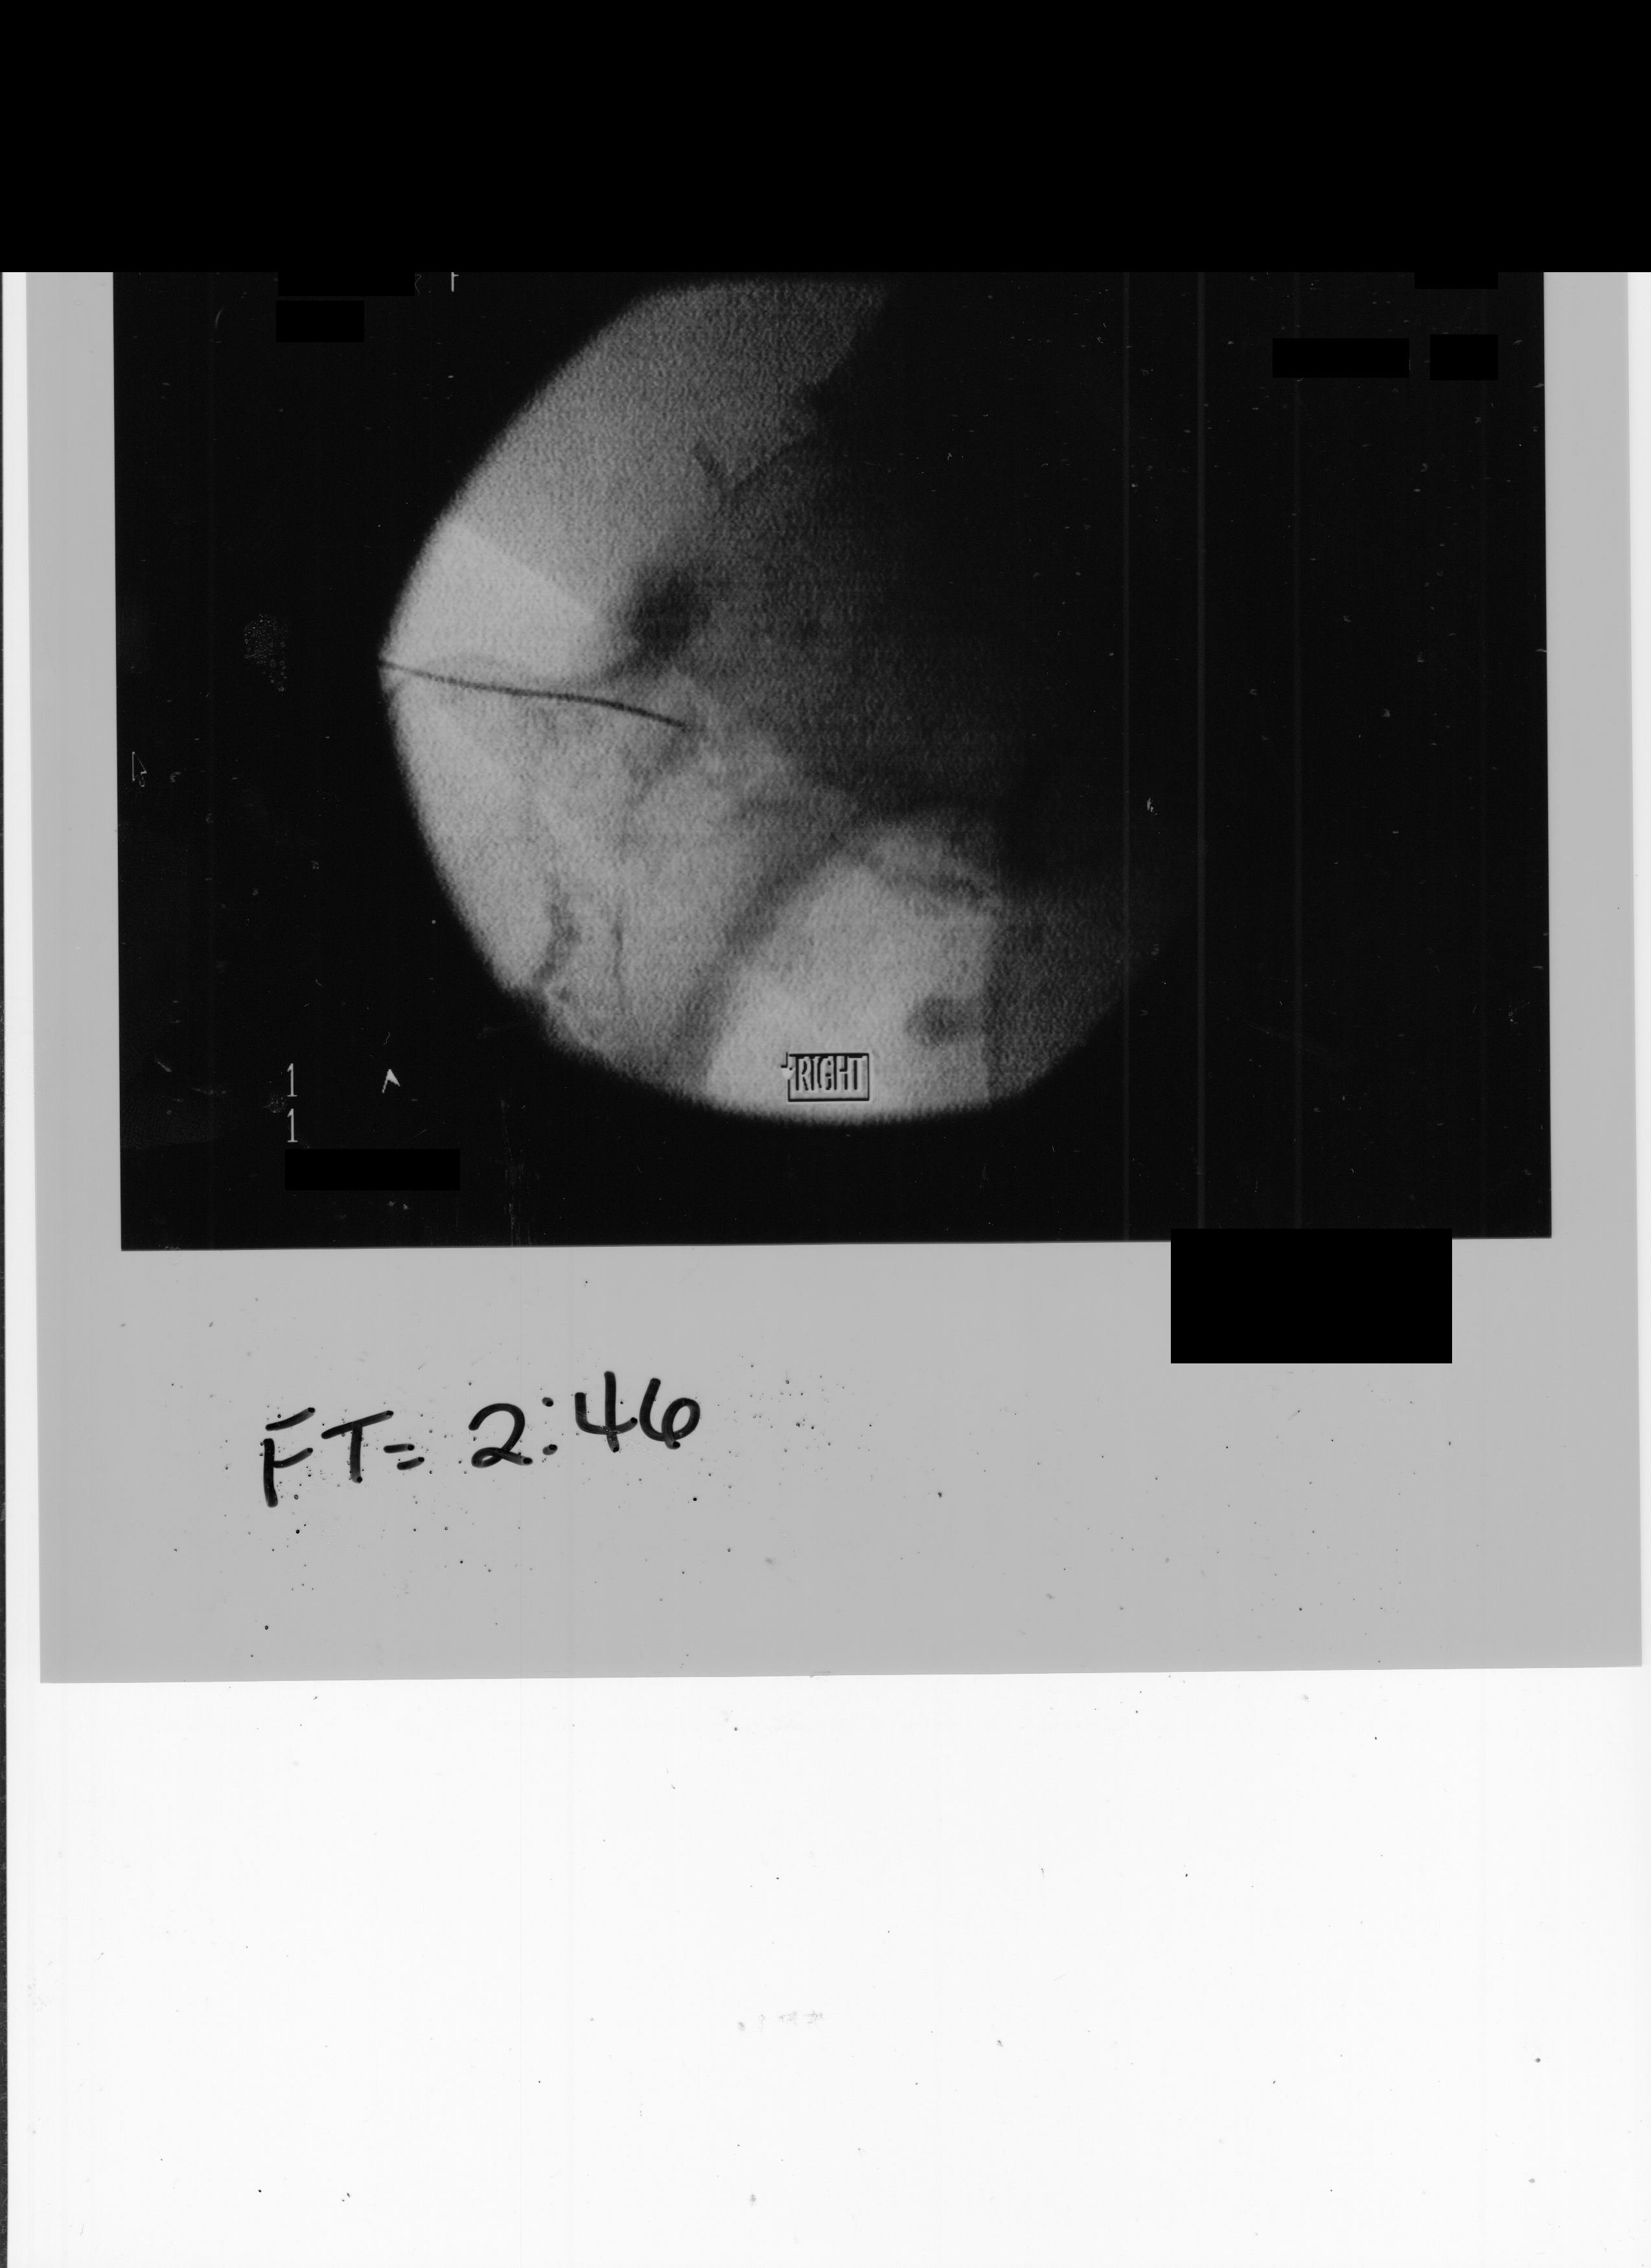

[1 of 1 positions shown; findings below may reference images not displayed]

EXAM:
RIGHT HIP INJECTION UNDER FLUOROSCOPY

FLUOROSCOPY TIME:  2 min 46 seconds

PROCEDURE:
Overlying skin prepped with Betadine, draped in the usual sterile
fashion, and infiltrated locally with buffered Lidocaine. Curved 22
gauge spinal needle advanced to the superolateral margin of the
right femoral head. Diagnostic injection of iodinated contrast
initially demonstrated some pooling at the needle tip, but after
needle repositioning additional injection demonstrated
intra-articular spread without intravascular component.

120 mg Depo-Medrol and 2 ml Sensorcaine 0.5% were then administered.
No immediate complication.
IMPRESSION: Technically successful right hip injection under fluoroscopy.

## 2015-10-25 ENCOUNTER — Other Ambulatory Visit: Payer: Self-pay | Admitting: Adult Health

## 2015-10-28 ENCOUNTER — Other Ambulatory Visit: Payer: Self-pay | Admitting: Adult Health

## 2016-04-13 IMAGING — CR DG CHEST 2V
2 series · 2 of 2 positions shown · non-contrast
Comparison: None.

CLINICAL DATA: Preop for right hip surgery, diabetes, former
smoking history

EXAM:
CHEST  2 VIEW

[w chest pa]
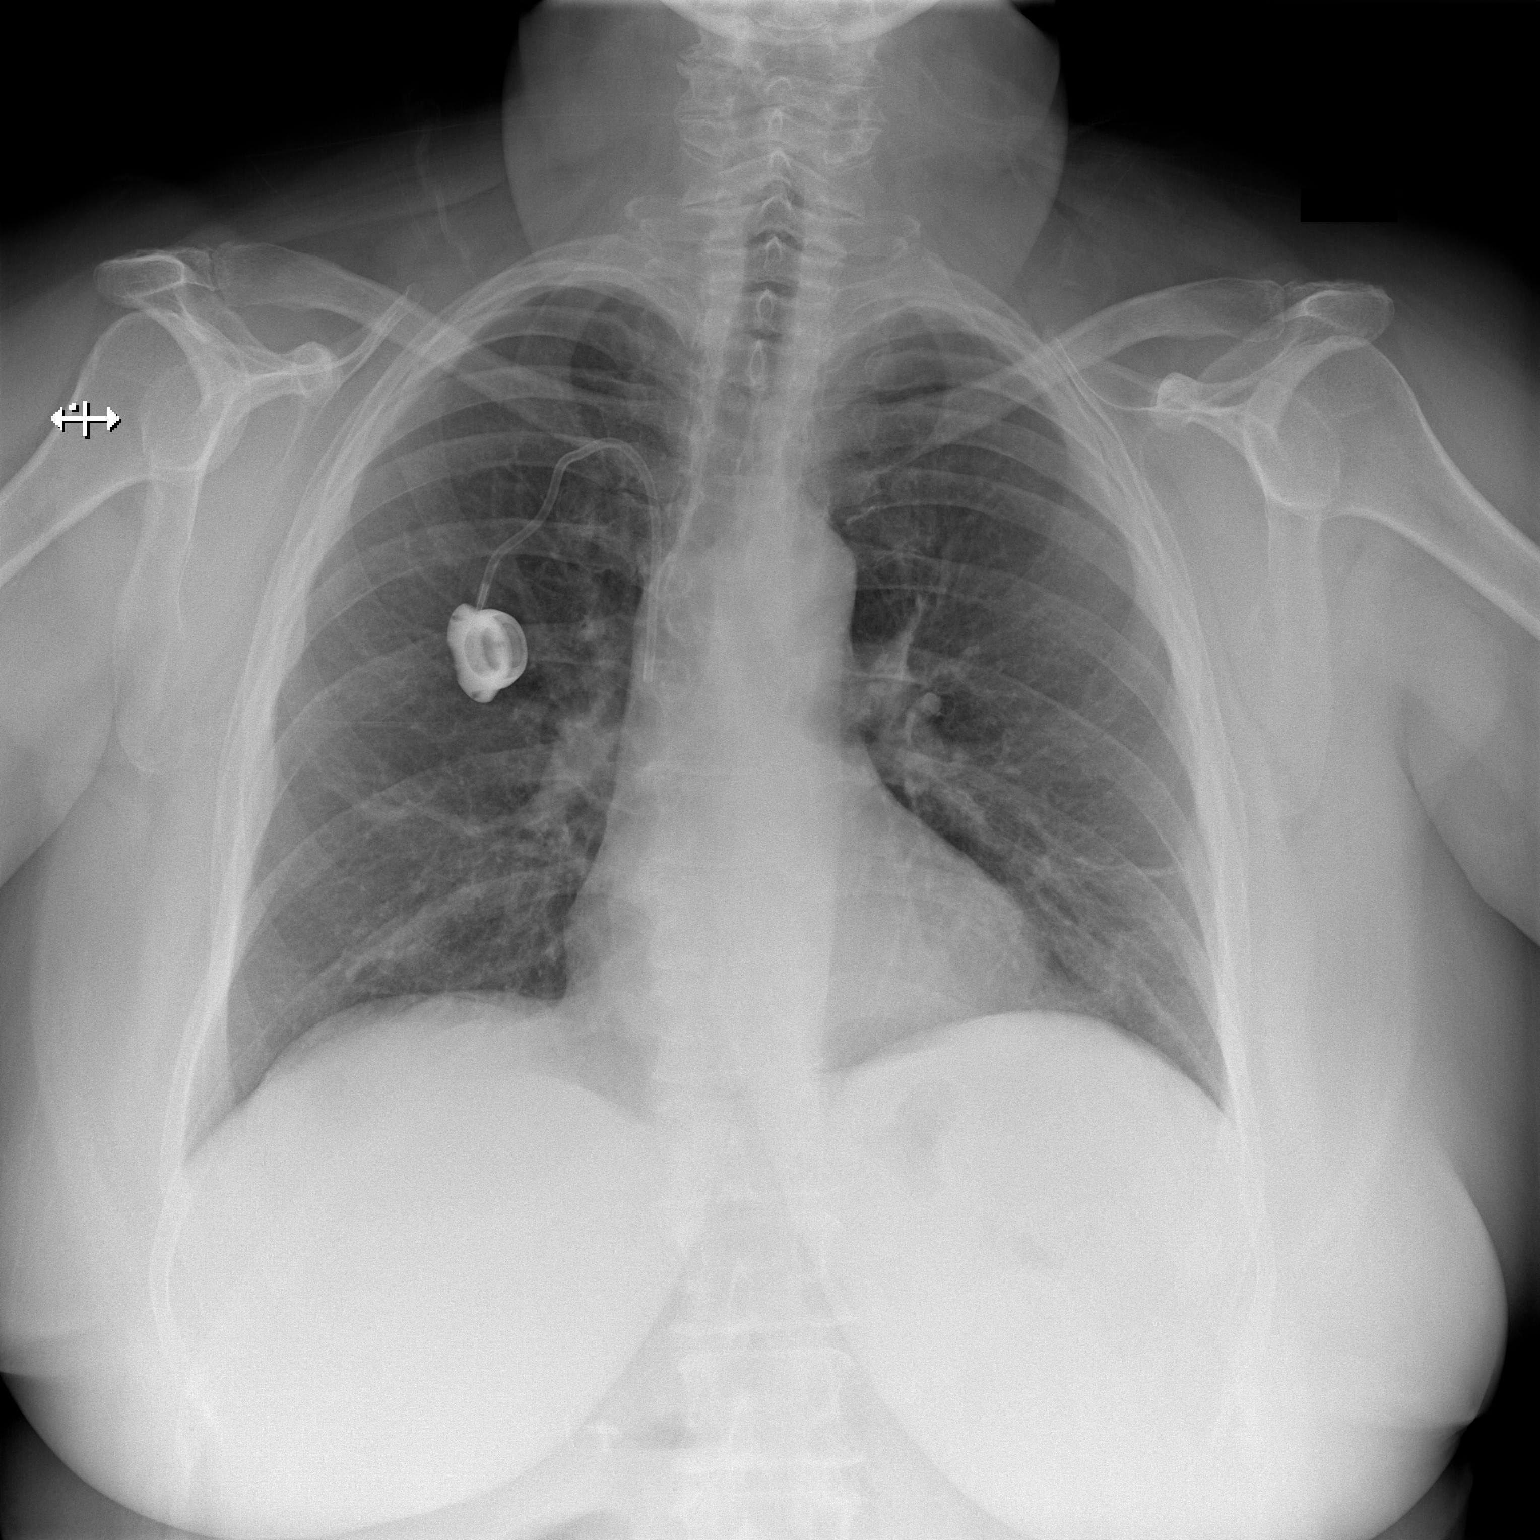

[w chest lat]
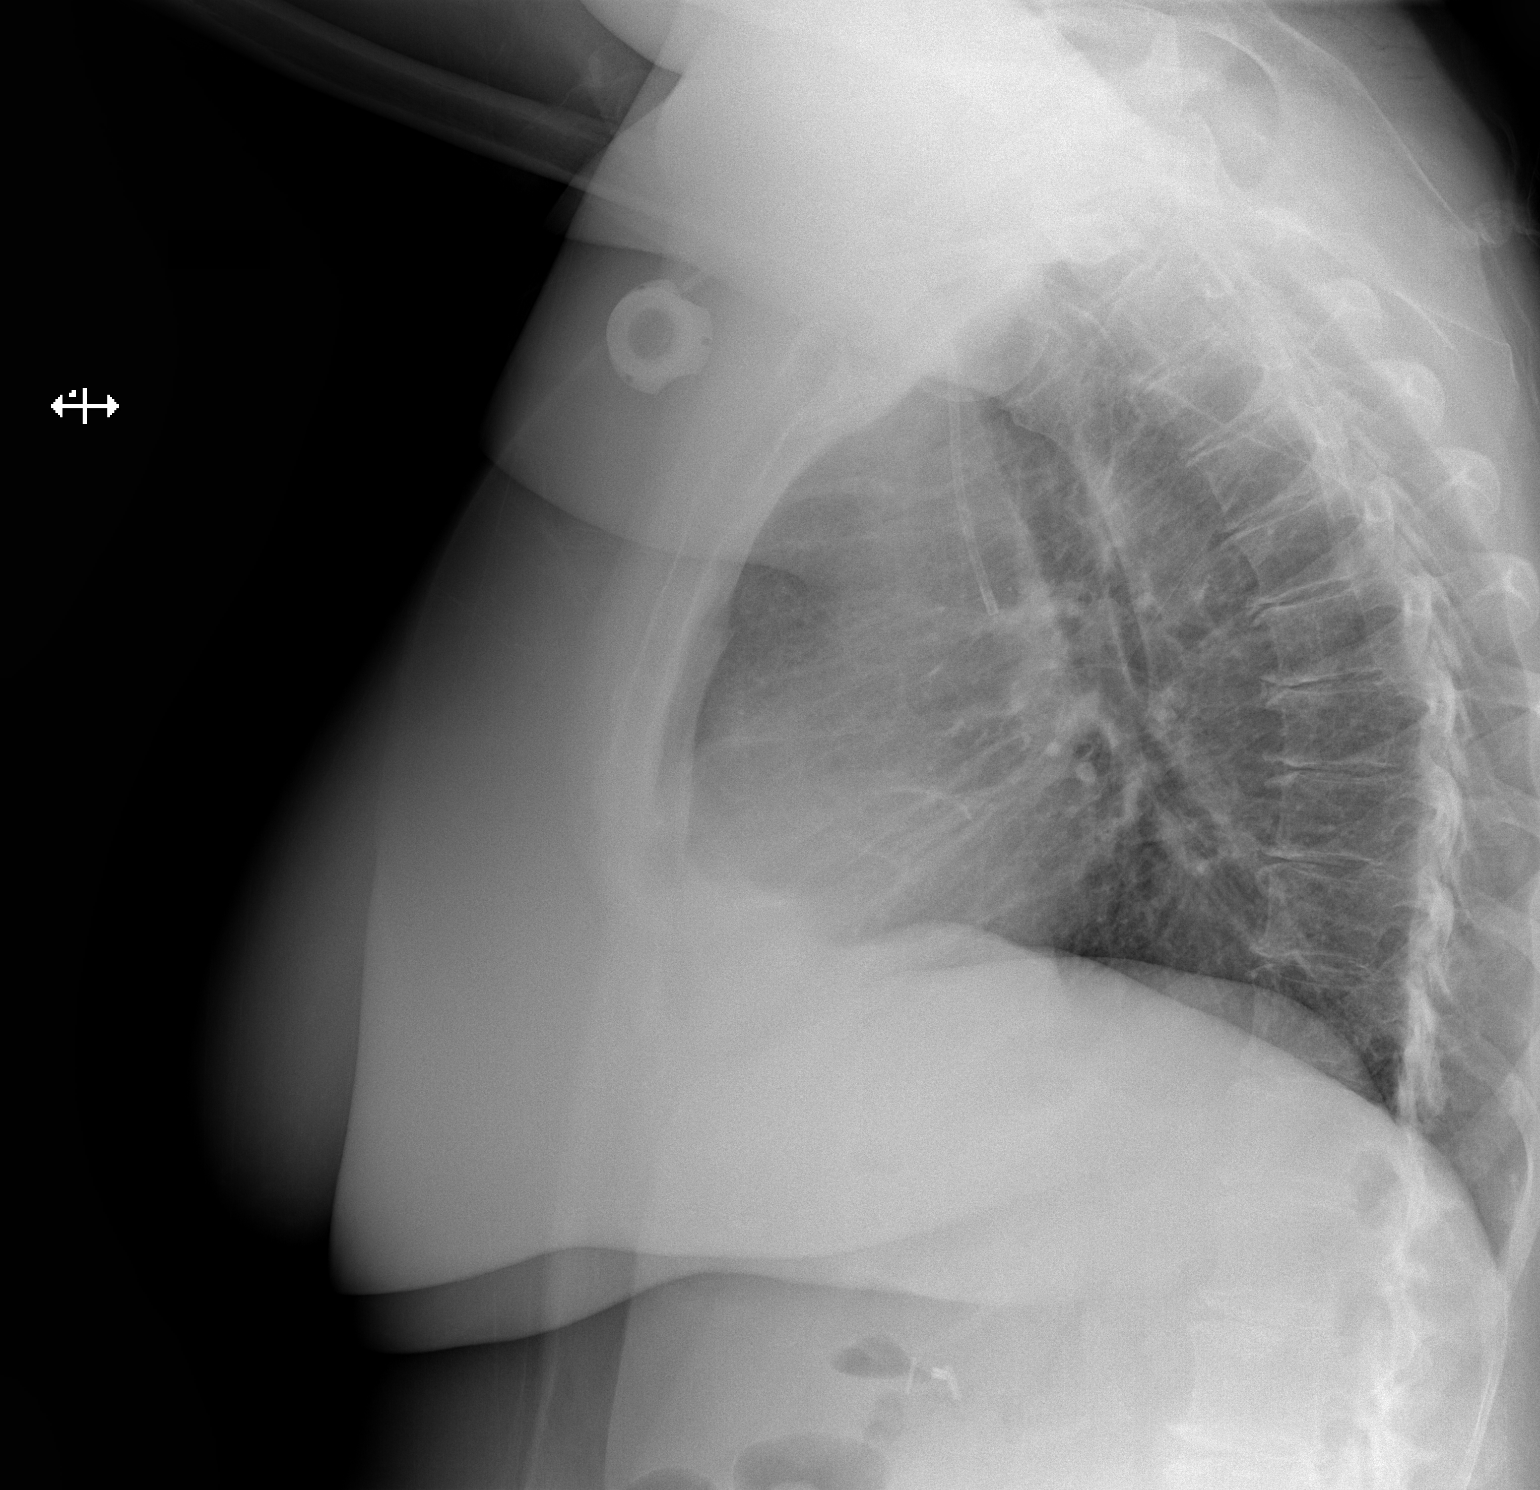

[2 of 2 positions shown; findings below may reference images not displayed]

FINDINGS: No active infiltrate or effusion is seen. Mediastinal and hilar
contours are unremarkable. The heart is within normal limits in
size. A right-sided Port-A-Cath is present with the tip overlying
the mid lower SVC. No acute bony abnormality is noted.
IMPRESSION: No active lung disease.  Port-A-Cath tip overlies the mid lower SVC.

## 2017-07-08 ENCOUNTER — Encounter: Payer: Medicare Other | Admitting: Neurology

## 2017-07-11 ENCOUNTER — Encounter: Payer: Self-pay | Admitting: Neurology
